# Patient Record
Sex: Male | Born: 1960 | Race: White | Hispanic: No | Marital: Married | Smoking: Former smoker
Health system: Southern US, Community
[De-identification: ages and names within clinical notes are randomized; demographics above are authoritative.]

## PROBLEM LIST (undated history)

## (undated) DIAGNOSIS — F4310 Post-traumatic stress disorder, unspecified: Secondary | ICD-10-CM

## (undated) DIAGNOSIS — I10 Essential (primary) hypertension: Secondary | ICD-10-CM

## (undated) DIAGNOSIS — I2510 Atherosclerotic heart disease of native coronary artery without angina pectoris: Secondary | ICD-10-CM

## (undated) DIAGNOSIS — I219 Acute myocardial infarction, unspecified: Secondary | ICD-10-CM

## (undated) DIAGNOSIS — E782 Mixed hyperlipidemia: Secondary | ICD-10-CM

## (undated) HISTORY — PX: CORONARY ARTERY BYPASS GRAFT: SHX141

## (undated) HISTORY — DX: Essential (primary) hypertension: I10

## (undated) HISTORY — PX: CAROTID STENT: SHX1301

## (undated) HISTORY — DX: Atherosclerotic heart disease of native coronary artery without angina pectoris: I25.10

## (undated) HISTORY — PX: HAND SURGERY: SHX662

## (undated) HISTORY — DX: Post-traumatic stress disorder, unspecified: F43.10

## (undated) HISTORY — DX: Acute myocardial infarction, unspecified: I21.9

## (undated) HISTORY — PX: BACK SURGERY: SHX140

## (undated) HISTORY — DX: Mixed hyperlipidemia: E78.2

---

## 1998-04-29 ENCOUNTER — Inpatient Hospital Stay (HOSPITAL_COMMUNITY): Admission: EM | Admit: 1998-04-29 | Discharge: 1998-05-03 | Payer: Self-pay | Admitting: Cardiology

## 1998-05-03 ENCOUNTER — Encounter: Payer: Self-pay | Admitting: Cardiology

## 1998-12-02 ENCOUNTER — Ambulatory Visit (HOSPITAL_COMMUNITY): Admission: RE | Admit: 1998-12-02 | Discharge: 1998-12-02 | Payer: Self-pay | Admitting: Cardiology

## 1999-09-06 ENCOUNTER — Inpatient Hospital Stay (HOSPITAL_COMMUNITY): Admission: EM | Admit: 1999-09-06 | Discharge: 1999-09-08 | Payer: Self-pay | Admitting: Psychiatry

## 2000-06-11 DIAGNOSIS — I219 Acute myocardial infarction, unspecified: Secondary | ICD-10-CM

## 2000-06-11 HISTORY — DX: Acute myocardial infarction, unspecified: I21.9

## 2001-03-04 ENCOUNTER — Inpatient Hospital Stay (HOSPITAL_COMMUNITY): Admission: EM | Admit: 2001-03-04 | Discharge: 2001-03-07 | Payer: Self-pay | Admitting: Cardiology

## 2001-03-04 ENCOUNTER — Encounter: Payer: Self-pay | Admitting: Cardiology

## 2002-04-01 ENCOUNTER — Encounter: Payer: Self-pay | Admitting: Cardiology

## 2004-04-20 ENCOUNTER — Observation Stay (HOSPITAL_COMMUNITY): Admission: RE | Admit: 2004-04-20 | Discharge: 2004-04-21 | Payer: Self-pay | Admitting: Neurosurgery

## 2004-11-17 ENCOUNTER — Ambulatory Visit: Payer: Self-pay | Admitting: Cardiology

## 2005-02-16 ENCOUNTER — Ambulatory Visit: Payer: Self-pay | Admitting: Cardiology

## 2005-02-28 ENCOUNTER — Ambulatory Visit: Payer: Self-pay | Admitting: Cardiology

## 2005-05-28 ENCOUNTER — Ambulatory Visit: Payer: Self-pay | Admitting: Cardiology

## 2005-06-08 ENCOUNTER — Ambulatory Visit: Payer: Self-pay | Admitting: Cardiology

## 2006-01-24 ENCOUNTER — Ambulatory Visit: Payer: Self-pay | Admitting: Cardiology

## 2006-07-06 ENCOUNTER — Encounter: Payer: Self-pay | Admitting: Cardiology

## 2006-08-28 ENCOUNTER — Encounter: Payer: Self-pay | Admitting: Physician Assistant

## 2006-08-28 ENCOUNTER — Ambulatory Visit: Payer: Self-pay | Admitting: Cardiology

## 2006-10-08 ENCOUNTER — Ambulatory Visit: Payer: Self-pay | Admitting: Cardiology

## 2006-10-10 ENCOUNTER — Ambulatory Visit: Payer: Self-pay | Admitting: Cardiology

## 2008-07-27 ENCOUNTER — Ambulatory Visit: Payer: Self-pay | Admitting: Cardiology

## 2008-12-23 ENCOUNTER — Telehealth: Payer: Self-pay | Admitting: Cardiology

## 2009-03-14 ENCOUNTER — Ambulatory Visit: Payer: Self-pay | Admitting: Cardiology

## 2009-03-14 DIAGNOSIS — I10 Essential (primary) hypertension: Secondary | ICD-10-CM

## 2009-03-14 DIAGNOSIS — Z951 Presence of aortocoronary bypass graft: Secondary | ICD-10-CM

## 2009-03-14 DIAGNOSIS — F4310 Post-traumatic stress disorder, unspecified: Secondary | ICD-10-CM

## 2009-03-14 DIAGNOSIS — M79609 Pain in unspecified limb: Secondary | ICD-10-CM

## 2009-03-14 DIAGNOSIS — E782 Mixed hyperlipidemia: Secondary | ICD-10-CM

## 2009-03-18 ENCOUNTER — Encounter: Payer: Self-pay | Admitting: Cardiology

## 2009-03-24 ENCOUNTER — Encounter (INDEPENDENT_AMBULATORY_CARE_PROVIDER_SITE_OTHER): Payer: Self-pay | Admitting: *Deleted

## 2009-04-26 ENCOUNTER — Telehealth (INDEPENDENT_AMBULATORY_CARE_PROVIDER_SITE_OTHER): Payer: Self-pay | Admitting: *Deleted

## 2009-10-25 ENCOUNTER — Encounter: Payer: Self-pay | Admitting: Physician Assistant

## 2010-02-02 ENCOUNTER — Ambulatory Visit: Payer: Self-pay | Admitting: Cardiology

## 2010-02-02 ENCOUNTER — Encounter (INDEPENDENT_AMBULATORY_CARE_PROVIDER_SITE_OTHER): Payer: Self-pay | Admitting: *Deleted

## 2010-02-02 DIAGNOSIS — I2510 Atherosclerotic heart disease of native coronary artery without angina pectoris: Secondary | ICD-10-CM

## 2010-02-14 ENCOUNTER — Encounter: Payer: Self-pay | Admitting: Physician Assistant

## 2010-02-14 ENCOUNTER — Ambulatory Visit: Payer: Self-pay | Admitting: Cardiology

## 2010-03-01 DIAGNOSIS — I2510 Atherosclerotic heart disease of native coronary artery without angina pectoris: Secondary | ICD-10-CM | POA: Insufficient documentation

## 2010-03-06 ENCOUNTER — Telehealth (INDEPENDENT_AMBULATORY_CARE_PROVIDER_SITE_OTHER): Payer: Self-pay | Admitting: *Deleted

## 2010-03-08 ENCOUNTER — Ambulatory Visit: Payer: Self-pay | Admitting: Cardiology

## 2010-03-15 ENCOUNTER — Ambulatory Visit: Payer: Self-pay | Admitting: Cardiology

## 2010-03-15 ENCOUNTER — Encounter (INDEPENDENT_AMBULATORY_CARE_PROVIDER_SITE_OTHER): Payer: Self-pay | Admitting: *Deleted

## 2010-03-15 DIAGNOSIS — F17200 Nicotine dependence, unspecified, uncomplicated: Secondary | ICD-10-CM

## 2010-03-15 DIAGNOSIS — R072 Precordial pain: Secondary | ICD-10-CM

## 2010-03-16 ENCOUNTER — Encounter: Payer: Self-pay | Admitting: Cardiology

## 2010-03-17 ENCOUNTER — Encounter: Payer: Self-pay | Admitting: Cardiology

## 2010-03-20 ENCOUNTER — Encounter: Payer: Self-pay | Admitting: Cardiology

## 2010-03-21 ENCOUNTER — Ambulatory Visit: Payer: Self-pay | Admitting: Cardiology

## 2010-03-21 ENCOUNTER — Inpatient Hospital Stay (HOSPITAL_BASED_OUTPATIENT_CLINIC_OR_DEPARTMENT_OTHER): Admission: RE | Admit: 2010-03-21 | Discharge: 2010-03-21 | Payer: Self-pay | Admitting: Cardiology

## 2010-03-29 ENCOUNTER — Encounter (INDEPENDENT_AMBULATORY_CARE_PROVIDER_SITE_OTHER): Payer: Self-pay | Admitting: *Deleted

## 2010-03-30 ENCOUNTER — Encounter: Payer: Self-pay | Admitting: Cardiology

## 2010-04-13 ENCOUNTER — Ambulatory Visit: Payer: Self-pay | Admitting: Physician Assistant

## 2010-04-18 ENCOUNTER — Encounter: Payer: Self-pay | Admitting: Cardiology

## 2010-07-11 NOTE — Miscellaneous (Signed)
Summary: rx - pravastatin  Clinical Lists Changes  Medications: Added new medication of PRAVASTATIN SODIUM 40 MG TABS (PRAVASTATIN SODIUM) take 2 tabs at bedtime

## 2010-07-11 NOTE — Assessment & Plan Note (Signed)
Summary: f/u echo  --agh   Visit Type:  Follow-up Primary Provider:  Bluth   History of Present Illness: the patient is a 50 year old male with a history of coronary artery disease, status post coronary artery bypass grafting in 1999. In 2002 the patient had a myocardial infarction with ventricular fibrillation and was found to have an occluded vein graft.the patient also has a history of posttraumatic stress syndrome/bipolar disorder.  The patient is unable to tolerate statin therapy on a daily basis and takes it every other day. He also takes gemfibrozil. He has been taken off Seroquel because of his high triglycerides but now has more difficulty sleeping on low dose trazodone. He also has lost weight likely because his Seroquel was discontinued.  During the last office visit the patient complained of chest tightness in the lower sternal area which happens intermittently. There was no associated shortness of breath or radiation of the pain. Nitroglycerin does not consistently  resolve his chest pain.sometimes he feels the pain feels like indigestion. He does not like to take nitroglycerin because of headaches. Subsequently the patient was referred for a myocardial perfusion study. Ejection fraction was 49% there appeared to be several small reversible defects in the study was felt to be abnormal. An echocardiogram demonstrated an EF of 50-55% with the mid anteroseptal and apical wall segments hypokinetic. The patient states that since his stress test he has felt poorly with ongoing chest pain.  The patient is here to discuss the possibility of cardiac catheterization for further evaluation. He was also counseled regarding tobacco use.   Preventive Screening-Counseling & Management  Alcohol-Tobacco     Smoking Status: current     Smoking Cessation Counseling: yes     Packs/Day: 1/2 PPD  Current Medications (verified): 1)  Metoprolol Succinate 50 Mg Xr24h-Tab (Metoprolol Succinate) .... Take  1 Tablet By Mouth Once A Day 2)  Norvasc 5 Mg Tabs (Amlodipine Besylate) .... Take 1 Tablet By Mouth Once A Day 3)  Hydrocodone-Acetaminophen 5-325 Mg Tabs (Hydrocodone-Acetaminophen) .... Take 1 Tablet By Mouth Four Times A Day 4)  Alprazolam 1 Mg Tabs (Alprazolam) .... Take 1 Tablet By Mouth Four Times A Day 5)  Aspirin 325 Mg Tabs (Aspirin) .... Take 1 Tablet By Mouth Once A Day 6)  Multivitamins  Tabs (Multiple Vitamin) .... Take 1 Tablet By Mouth Once A Day 7)  Vitamin D 400 Unit Caps (Cholecalciferol) .... Take 1 Tablet By Mouth Once A Day 8)  Nitrostat 0.4 Mg Subl (Nitroglycerin) .... Dissolve One Tablet Under Tongue For Severe Chest Pain As Needed Every 5 Minutes, Not To Exceed 3 in 15 Min Time Frame 9)  Pravastatin Sodium 40 Mg Tabs (Pravastatin Sodium) .... Take 1 Tablet By Mouth Once A Day 10)  Gemfibrozil 600 Mg Tabs (Gemfibrozil) .... Take 1 Tablet By Mouth Two Times A Day 11)  Trazodone Hcl 50 Mg Tabs (Trazodone Hcl) .... Take 1 Tablet By Mouth Once A Day 12)  Prednisone 20 Mg Tabs (Prednisone) .... Take 60 Mg (3 Tabs) At 6:00p.m. On The Night Prior To Procedure, 12 Midnight, and 6:00a.m. of Procedure 13)  Diphenhydramine Hcl 25 Mg Caps (Diphenhydramine Hcl) .... Take At 6:00p.m. The Night Prior To Procedur, 12 Midnight, and 6:00a.m. of Procedure 14)  Zantac 150 Maximum Strength 150 Mg Tabs (Ranitidine Hcl) .... Take At 6:00p.m. The Night Prior To Procedure, 12 Midnight, and 6:00a.m. of Procedure  Allergies (verified): 1)  ! * Ivp Dye  Comments:  Nurse/Medical Assistant: The patient's medication  list and allergies were reviewed with the patient and were updated in the Medication and Allergy Lists.  Past History:  Family History: Last updated: Mar 15, 2009 mother died at age 58 from coronary artery disease the patient has a younger brother who had he has an older sister that died from coronary artery disease. Of her strong family history of hypercholesterolemia.  Social  History: Last updated: 03/15/2009 Tobacco Use - Yes.  Alcohol Use - no  Risk Factors: Smoking Status: current (03/15/2010) Packs/Day: 1/2 PPD (03/15/2010)  Past Medical History: chest pain rule out angina coronary artery disease status post coronary artery bypass grafting in 1999 with a vein graft to the circumflex AMI with ventricular fibrillation arrest September 2002 with occluded vein graft myocardial perfusion studycall February 14, 2010 abnormal with small perfusion defects and an ejection fraction of 49% Echocardiogram September 2011 ejection fraction 50-55%, mid anteroseptal and apical anterior wall motion segments abnormalities dyslipidemia history of intravenous dye allergy bipolar disorder tobacco use chronic back pain hypertension  Social History: Packs/Day:  1/2 PPD  Vital Signs:  Patient profile:   50 year old male Height:      66 inches Weight:      201 pounds Pulse rate:   69 / minute BP sitting:   130 / 78  (left arm) Cuff size:   large  Vitals Entered By: Carlye Grippe (March 15, 2010 10:10 AM)   Impression & Recommendations:  Problem # 1:  CORONARY ATHEROSCLEROSIS NATIVE CORONARY ARTERY (ICD-414.01) the patient has ongoing chest pain and does not like to take nitroglycerin. He has an abnormal perfusion study. I referred the patient for cardiac catheterization. I discussed risk and benefits of the procedure with the patient and is willing to proceed. He does have an IVP dye allergy and will be pretreated appropriately with prednisone, H1 and H2 blockers. His updated medication list for this problem includes:    Metoprolol Succinate 50 Mg Xr24h-tab (Metoprolol succinate) .Marland Kitchen... Take 1 tablet by mouth once a day    Norvasc 5 Mg Tabs (Amlodipine besylate) .Marland Kitchen... Take 1 tablet by mouth once a day    Aspirin 325 Mg Tabs (Aspirin) .Marland Kitchen... Take 1 tablet by mouth once a day    Nitrostat 0.4 Mg Subl (Nitroglycerin) .Marland Kitchen... Dissolve one tablet under tongue for  severe chest pain as needed every 5 minutes, not to exceed 3 in 15 min time frame  Problem # 2:  PRECORDIAL PAIN (ICD-786.51) some atypical features, but in light of abnormal perfusion study we'll proceed with cardiac catheterization His updated medication list for this problem includes:    Metoprolol Succinate 50 Mg Xr24h-tab (Metoprolol succinate) .Marland Kitchen... Take 1 tablet by mouth once a day    Norvasc 5 Mg Tabs (Amlodipine besylate) .Marland Kitchen... Take 1 tablet by mouth once a day    Aspirin 325 Mg Tabs (Aspirin) .Marland Kitchen... Take 1 tablet by mouth once a day    Nitrostat 0.4 Mg Subl (Nitroglycerin) .Marland Kitchen... Dissolve one tablet under tongue for severe chest pain as needed every 5 minutes, not to exceed 3 in 15 min time frame  Orders: T-Basic Metabolic Panel [id number]) T-CBC No Diff ([id number]) T-Protime, Auto ([id number]) T-PTT ([id number]) T-Chest x-ray, 2 views (66440) Cardiac Catheterization (Cardiac Cath)  Problem # 3:  PTSD (ICD-309.81) Assessment: Comment Only  Problem # 4:  CORONARY ARTERY BYPASS GRAFT, HX OF (ICD-V45.81) Assessment: Comment Only  Problem # 5:  TOBACCO ABUSE (ICD-305.1) I referred the patient to 1-800 -QUIT-NOW.  Patient Instructions: 1)  [facility]  Cath  2)  Stop smoking (1-800-QUIT-NOW) 3)  Follow up in  6 months Prescriptions: ZANTAC 150 MAXIMUM STRENGTH 150 MG TABS (RANITIDINE HCL) take at 6:00p.m. the night prior to procedure, 12 midnight, and 6:00a.m. of procedure  #3 x 0   Entered by:   Hoover Brunette, LPN   Authorized by:   Lewayne Bunting, MD, [facility]   Signed by:   Hoover Brunette, LPN on 16/03/9603   Method used:   Electronically to        [facility] # [phone]* (retail)       [address]       [place], [place]  [address]       Ph: [phone] or [phone]       Fax: [phone]   RxID:   [id number] DIPHENHYDRAMINE HCL 25 MG CAPS (DIPHENHYDRAMINE HCL) take at 6:00p.m. the night prior to procedur, 12 midnight, and 6:00a.m. of procedure  #3 x 0    Entered by:   Hoover Brunette, LPN   Authorized by:   Lewayne Bunting, MD, [facility]   Signed by:   Hoover Brunette, LPN on 74/25/9563   Method used:   Electronically to        [facility] # [phone]* (retail)       [address]       [place], [place]  [address]       Ph: [phone] or [phone]       Fax: [phone]   RxID:   [id number] PREDNISONE 20 MG TABS (PREDNISONE) take 60 mg (3 tabs) at 6:00p.m. on the night prior to procedure, 12 midnight, and 6:00a.m. of procedure  #9 x 0   Entered by:   Hoover Brunette, LPN   Authorized by:   Lewayne Bunting, MD, [facility]   Signed by:   Hoover Brunette, LPN on 61/60/7371   Method used:   Electronically to        [facility] # [phone]* (retail)       [address]       [place], [place]  [address]       Ph: [phone] or [phone]       Fax: [phone]   RxID:   [id number]

## 2010-07-11 NOTE — Letter (Signed)
Summary: Letter/ APPLICATION FOR HANDICAPPED STICKER  Letter/ APPLICATION FOR HANDICAPPED STICKER   Imported By: Dorise Hiss 02/03/2010 14:18:51  _____________________________________________________________________  External Attachment:    Type:   Image     Comment:   External Document

## 2010-07-11 NOTE — Letter (Signed)
Summary: Cardiac Cath Instructions - [facility]  [facility] [address]   [place], [place] [address]   Phone: [phone]  Fax: [phone]     03/15/2010 MRN: [id number]  Joseph [name] [address] [place], [place]  [address]  Dear [name],   You are scheduled for a Cardiac Catheterization on Tuesday, October 11 at 7:30 with Dr. Peter Swaziland.   Please arrive to the 1st floor of the Heart and Vascular Center at [facility] at 6:30 am on the day of your procedure. Please do not arrive before 6:30 a.m. Call the Heart and Vascular Center at [phone] if you are unable to make your appointmnet. The Code to get into the parking garage under the building is 0200. Take the elevators to the 1st floor. You must have someone to drive you home. Someone must be with you for the first 24 hours after you arrive home. Please wear clothes that are easy to get on and off and wear slip-on shoes. Do not eat or drink after midnight except water with your medications that morning. Bring all your medications and current insurance cards with you.  ___ DO NOT take these medications before your procedure:  _X__ Make sure you take your aspirin.  _X__ You may take ALL of your medications with water that morning.  ___ DO NOT take ANY medications before your procedure.  _X__ Pre-med instructions: see attached sheet.    The usual length of stay after your procedure is 2 to 3 hours. This can vary.  If you have any questions, please call the office at the number listed above.  Hoover Brunette, LPN                  Directions to the [facility]  Please Note : Park in [place] under the building not the parking deck.  From [employer]: Turn onto [address] Left onto [address] (1st stoplight) Right at the brick entrance to the hospital ([address] circle [address]) Bear to the right and you will see a blue sign "Heart and  Vascular Center" Parking garage is a sharp right'to get through the gate out in the code _______. Once you park, take the elevator to the first floor. Please do not arrive before 0630am. The building will be dark before that time.   From [address] Turn onto [address] Turn left into the brick entrance to the hospital (Main circle drive) Bear to the right and you will see a blue sign "Heart and Vascular Center" Parking garage is a sharp right, to get thru the gate put in the code ____. Once you park, take the elevator to the first floor. Please do not arrive before 0630am. The building will be dark before that time

## 2010-07-11 NOTE — Cardiovascular Report (Signed)
Summary: Cardiac Catheterization  Cardiac Catheterization   Imported By: Dorise Hiss 02/02/2010 08:16:25  _____________________________________________________________________  External Attachment:    Type:   Image     Comment:   External Document

## 2010-07-11 NOTE — Letter (Signed)
Summary: Lexiscan or Dobutamine Pharmacist, community at [facility]  [address]   [place], [place] [address]   Phone: [phone]  Fax: [phone]      [facility] Cardiovascular Services  Lexiscan or Dobutamine Cardiolite Strss Test    [facility]  Appointment Date:_  Appointment Time:_  Your doctor has ordered a CARDIOLITE STRESS TEST using a medication to stimulate exercise so that you will not have to walk on the treadmill to determine the condition of your heart during stress. If you take blood pressure medication, ask your doctor if you should take it the day of your test. You should not have anything to eat or drink at least 4 hours before your test is scheduled, and no caffeine, including decaffeinated tea and coffee, chocolate, and soft drinks for 24 hours before your test.  You will need to register at the Outpatient/Main Entrance at the hospital 15 minutes before your appointment time. It is a good idea to bring a copy of your order with you. They will direct you to the Diagnostic Imaging (Radiology) Department.  You will be asked to undress from the waist up and given a hospital gown to wear, so dress comfortably from the waist down for example: Sweat pants, shorts, or skirt Rubber soled lace up shoes (tennis shoes)  Plan on about three hours from registration to release from the hospital

## 2010-07-11 NOTE — Letter (Signed)
Summary: Internal Correspondence/ FAXED PRE-CATH ORDER  Internal Correspondence/ FAXED PRE-CATH ORDER   Imported By: Dorise Hiss 03/28/2010 15:04:04  _____________________________________________________________________  External Attachment:    Type:   Image     Comment:   External Document

## 2010-07-11 NOTE — Letter (Addendum)
Summary: Letter/ [employer].  Letter/ [employer].   Imported By: Dorise Hiss 04/10/2010 10:35:46  _____________________________________________________________________  External Attachment:    Type:   Image     Comment:   External Document  Appended Document: Letter/ [employer]. Forward to whomever did intervention. See cath records.

## 2010-07-11 NOTE — Progress Notes (Signed)
Summary: ?? d/c Imdur  Phone Note Call from Patient   Summary of Call: Wants to know if he can stop his Imdur.  States he started med on 9/22 and started feeling bad on 9/24.  States that when he gets up from sitting to standing he feels very dizzy and lightheaded.  Did feel like he was going to pass out earlier this a.m. - episode lasted about 30 seconds, but then went away after he bent down with head between knees.  Does have pending echo scheduled for this Wednesday, 9/28. Does not have BP machine at home.  Initial call taken by: Hoover Brunette, LPN,  March 06, 2010 2:37 PM  Follow-up for Phone Call        Yes he can Follow-up by: Lewayne Bunting, MD, [facility],  March 06, 2010 4:39 PM  Additional Follow-up for Phone Call Additional follow up Details #1::        Patient notified.    Additional Follow-up by: Hoover Brunette, LPN,  March 06, 2010 4:56 PM     Appended Document: ?? d/c Imdur I reveiwed ECHO today EF=50-55%. Small degree anterior hypokinesis. Unlesscomplaints of chest pain, continue medical rx and risk factor modification.  Appended Document: ?? d/c Imdur Can discuss at OV tomorrow, 10/5.

## 2010-07-11 NOTE — Assessment & Plan Note (Signed)
Summary: EPH   Visit Type:  Follow-up Primary Provider:  Bluth   History of Present Illness: patient returns for post catheterization followup, following recent referral, by Dr. Andee Lineman, for evaluation of atypical chest pain and an abnormal perfusion study. Patient did require pretreatment for IVP dye allergy.  Cardiac catheterization was performed on 10/11, by Dr. Peter Swaziland, yielding single-vessel CAD with preserved LVF (EF 55%), with severe inferobasal HK. Specifically, there was 50-60% proximal RCA stenosis, followed by a 40% lesion. There was previously documented proximal occlusion of the CFX at the previous stent site, with excellent bridging collaterals.  Patient underwent single-vessel CABG in 1999, secondary to in-stent restenosis of the CFX. He then had an MI in 2002, secondary to 100% occlusion of the SVG-CFX graft.  Clinically, he continues to complain of intermittent chest pain which is unpredictable, and lasts a few minutes in duration. It is unrelieved by either Zantac or one nitroglycerin tablet.at  Of note, this has not changed significantly from his baseline.  He also complains of intermittent "skipped beats", but denies any suggestion of "fluttering". Unfortunately, patient continues to smoke.   Patient denies any known complications of the right groin incision site.  Preventive Screening-Counseling & Management  Alcohol-Tobacco     Smoking Status: current     Smoking Cessation Counseling: yes     Packs/Day: 1/2 PPD  Current Medications (verified): 1)  Metoprolol Succinate 50 Mg Xr24h-Tab (Metoprolol Succinate) .... Take 1 Tablet By Mouth Once A Day 2)  Norvasc 5 Mg Tabs (Amlodipine Besylate) .... Take 1 Tablet By Mouth Once A Day 3)  Hydrocodone-Acetaminophen 5-325 Mg Tabs (Hydrocodone-Acetaminophen) .... Take 1 Tablet By Mouth Four Times A Day 4)  Alprazolam 1 Mg Tabs (Alprazolam) .... Take 1 Tablet By Mouth Four Times A Day 5)  Aspirin 325 Mg Tabs (Aspirin)  .... Take 1 Tablet By Mouth Once A Day 6)  Multivitamins  Tabs (Multiple Vitamin) .... Take 1 Tablet By Mouth Once A Day 7)  Vitamin D 400 Unit Caps (Cholecalciferol) .... Take 1 Tablet By Mouth Once A Day 8)  Nitrostat 0.4 Mg Subl (Nitroglycerin) .... Dissolve One Tablet Under Tongue For Severe Chest Pain As Needed Every 5 Minutes, Not To Exceed 3 in 15 Min Time Frame 9)  Pravastatin Sodium 40 Mg Tabs (Pravastatin Sodium) .... Take 1 Tablet By Mouth Once A Day 10)  Gemfibrozil 600 Mg Tabs (Gemfibrozil) .... Take 1 Tablet By Mouth Two Times A Day 11)  Trazodone Hcl 50 Mg Tabs (Trazodone Hcl) .... Take 1 Tablet By Mouth Once A Day  Allergies (verified): 1)  ! * Ivp Dye  Comments:  Nurse/Medical Assistant: The patient's medication list and allergies were reviewed with the patient and were updated in the Medication and Allergy Lists.  Past History:  Past Medical History: Last updated: 03/15/2010 chest pain rule out angina coronary artery disease status post coronary artery bypass grafting in 1999 with a vein graft to the circumflex AMI with ventricular fibrillation arrest September 2002 with occluded vein graft myocardial perfusion studycall February 14, 2010 abnormal with small perfusion defects and an ejection fraction of 49% Echocardiogram September 2011 ejection fraction 50-55%, mid anteroseptal and apical anterior wall motion segments abnormalities dyslipidemia history of intravenous dye allergy bipolar disorder tobacco use chronic back pain hypertension  Review of Systems       No fevers, chills, hemoptysis, dysphagia, melena, hematocheezia, hematuria, rash, claudication, orthopnea, pnd, pedal edema. All other systems negative.   Vital Signs:  Patient profile:  50 year old male Height:      66 inches Weight:      203 pounds Pulse rate:   72 / minute BP sitting:   136 / 90  (left arm) Cuff size:   large  Vitals Entered By: Carlye Grippe (April 13, 2010 1:13  PM)  Physical Exam  Additional Exam:  GEN: 50 year old male, mildly obese, sitting upright, no distress HEENT: NCAT,PERRLA,EOMI NECK: palpable pulses, no bruits; no JVD; no TM LUNGS: CTA bilaterally HEART: RRR (S1S2); no significant murmurs; no rubs; no gallops ABD: soft, NT; intact BS EXT: intact distal pulses; no edema SKIN: warm, dry MUSC: no obvious deformity NEURO: A/O (x3)     Impression & Recommendations:  Problem # 1:  PRECORDIAL PAIN (ICD-786.51)  continue medical management for single-vessel coronary artery disease, status post recent cardiac catheterization. The patient's symptoms are atypical in that they are unpredictable in onset, and not strictly correlated with exertion. Will increase beta blocker both for additional antianginal benefit, as well as suppression of probable PVCs. Will decrease aspirin to 81 mg daily, indefinitely.  Problem # 2:  HYPERLIPIDEMIA, MIXED (ICD-272.2)  we'll request most recent lipid profile from Dr. Pauletta Browns office. Aggressive management recommended with target LDL 70 or less, if feasible. Patient is on additional therapy for reported hypertriglyceridemia  Problem # 3:  TOBACCO ABUSE (ICD-305.1)  unfortunately, patient continues to smoke. He was given the 1 800 QUIT NOW number, at time of last visit.  Problem # 4:  PTSD (ICD-309.81) Assessment: Comment Only  Patient Instructions: 1)  Your physician wants you to follow-up in: 3 months. You will receive a reminder letter in the mail one-two months in advance. If you don't receive a letter, please call our office to schedule the follow-up appointment. 2)  Decrease Aspirin to 162 mg (1/2 of 325mg  tablet), then when you use all of your 325mg  tablets. Start 81mg  Aspirin once daily. 3)  Increase Toprol (metoprolol) to 75mg  by mouth once daily. This is 1 1/2 of your 50mg  tablets.  4)  We have requested your recent labs from Dr. Loney Hering.  Prescriptions: METOPROLOL SUCCINATE 50 MG XR24H-TAB  (METOPROLOL SUCCINATE) Take 1 1/2 tablet by mouth once a day  #45 x 6   Entered by:   Cyril Loosen, RN, BSN   Authorized by:   Nelida Meuse, PA-C   Signed by:   Cyril Loosen, RN, BSN on 04/13/2010   Method used:   Electronically to        [facility] # [phone]* (retail)       [address]       [place], [place]  [address]       Ph: [phone] or [phone]       Fax: [phone]   RxID:   [id number]  I have reviewed and approved all prescriptions at the time of the office visit. Nelida Meuse, PA-C  April 13, 2010 2:02 PM

## 2010-07-11 NOTE — Assessment & Plan Note (Signed)
Summary: 6 MO FU   Visit Type:  Follow-up Primary Provider:  Bluth   History of Present Illness: the patient is a 50 year old male with a history of coronary artery disease, status post coronary artery bypass grafting in 1999. In 2002 the patient had a myocardial infarction with ventricular fibrillation and was found to have an occluded vein graft. He has normal LV function however.  The patient is doing well from a cardiovascular perspective but does complain of some tight feeling in the lower sternal area which happens intermittently. There is no associated shortness of breath or radiation of the pain. Nitroglycerin does not consistently relieve the problem. He states first couple of weeks his pain was very bothersome. It has got a little bit better now.  The patient is unable to tolerate statin therapy on a daily basis and takes it every other day. He also takes gemfibrozil. He has been taken off Seroquel because of his high triglycerides but now has more difficulty sleeping on low dose trazodone. He also has lost weight likely because his Seroquel was discontinued.  The patient denies any palpitations orthopnea PND.  In the last visit I told the patient to get a cat as a companion. He states this has helped him a lot with his bipolar disorder as well as been a great companion for his daughter who suffers chronic migraine headaches.  Preventive Screening-Counseling & Management  Alcohol-Tobacco     Smoking Status: current     Smoking Cessation Counseling: yes     Packs/Day: 1/4 PPD  Current Medications (verified): 1)  Metoprolol Succinate 50 Mg Xr24h-Tab (Metoprolol Succinate) .... Take 1 Tablet By Mouth Once A Day 2)  Norvasc 5 Mg Tabs (Amlodipine Besylate) .... Take 1 Tablet By Mouth Once A Day 3)  Hydrocodone-Acetaminophen 5-325 Mg Tabs (Hydrocodone-Acetaminophen) .... Take 1 Tablet By Mouth Four Times A Day 4)  Alprazolam 1 Mg Tabs (Alprazolam) .... Take 1 Tablet By Mouth Four  Times A Day 5)  Aspirin 325 Mg Tabs (Aspirin) .... Take 1 Tablet By Mouth Once A Day 6)  Multivitamins  Tabs (Multiple Vitamin) .... Take 1 Tablet By Mouth Once A Day 7)  Vitamin D 400 Unit Caps (Cholecalciferol) .... Take 1 Tablet By Mouth Once A Day 8)  Nitrostat 0.4 Mg Subl (Nitroglycerin) .... Dissolve One Tablet Under Tongue For Severe Chest Pain As Needed Every 5 Minutes, Not To Exceed 3 in 15 Min Time Frame 9)  Pravastatin Sodium 40 Mg Tabs (Pravastatin Sodium) .... Take 1 Tablet By Mouth Once A Day 10)  Gemfibrozil 600 Mg Tabs (Gemfibrozil) .... Take 1 Tablet By Mouth Two Times A Day 11)  Trazodone Hcl 50 Mg Tabs (Trazodone Hcl) .... Take 1 Tablet By Mouth Once A Day  Allergies (verified): 1)  ! * Ivp Dye  Comments:  Nurse/Medical Assistant: The patient's medication list and allergies were reviewed with the patient and were updated in the Medication and Allergy Lists.  Past History:  Past Medical History: Last updated: 07/26/2008 chest pain rule out angina coronary artery disease status post coronary artery bypass grafting in 1999 with a vein graft to the circumflex AMI with ventricular fibrillation arrest September 2002 with occluded vein graft preserved left ventricular function next dyslipidemia history of intravenous dye allergy bipolar disorder tobacco use chronic back pain hypertension  Family History: Last updated: 2009/03/24 mother died at age 17 from coronary artery disease the patient has a younger brother who had he has an older sister that died from  coronary artery disease. Of her strong family history of hypercholesterolemia.  Social History: Last updated: 03/14/2009 Tobacco Use - Yes.  Alcohol Use - no  Risk Factors: Smoking Status: current (02/02/2010) Packs/Day: 1/4 PPD (02/02/2010)  Social History: Packs/Day:  1/4 PPD  Vital Signs:  Patient profile:   50 year old male Height:      66 inches Weight:      205 pounds BMI:     33.21 Pulse  rate:   78 / minute BP sitting:   117 / 87  (left arm) Cuff size:   regular  Vitals Entered By: Carlye Grippe (February 02, 2010 10:01 AM)  Nutrition Counseling: Patient's BMI is greater than 25 and therefore counseled on weight management options.  Physical Exam  Additional Exam:  General: Well-developed, well-nourished in no distress head: Normocephalic and atraumatic eyes PERRLA/EOMI intact, conjunctiva and lids normal nose: No deformity or lesions mouth normal dentition, normal posterior pharynx neck: Supple, no JVD.  No masses, thyromegaly or abnormal cervical nodes lungs: Normal breath sounds bilaterally without wheezing.  Normal percussion heart: regular rate and rhythm with normal S1 and S2, no S3 or S4.  PMI is normal.  No pathological murmurs abdomen: Normal bowel sounds, abdomen is soft and nontender without masses, organomegaly or hernias noted.  No hepatosplenomegaly musculoskeletal: Back normal, normal gait muscle strength and tone normal pulsus: Pulse is normal in all 4 extremities Extremities: No peripheral pitting edema neurologic: Alert and oriented x 3 skin: Intact without lesions or rashes cervical nodes: No significant adenopathy psychologic: Normal affect    Impression & Recommendations:  Problem # 1:  CORONARY ARTERY BYPASS GRAFT, HX OF (ICD-V45.81) the patient reports some substernal chest pain. It has been several years since we did a stress test. I ordered a Lexiscan. In particular the patient continues to smokeiand his therapy with statin and suboptimal  Problem # 2:  HYPERLIPIDEMIA, MIXED (ICD-272.2) as outlined above. His updated medication list for this problem includes:    Pravastatin Sodium 40 Mg Tabs (Pravastatin sodium) .Marland Kitchen... Take 1 tablet by mouth once a day    Gemfibrozil 600 Mg Tabs (Gemfibrozil) .Marland Kitchen... Take 1 tablet by mouth two times a day  Problem # 3:  PTSD (ICD-309.81) Assessment: Comment Only  Problem # 4:  ESSENTIAL HYPERTENSION,  BENIGN (ICD-401.1) blood pressure is well controlled on his current medical regimen. The following medications were removed from the medication list:    Hydrochlorothiazide 25 Mg Tabs (Hydrochlorothiazide) .Marland Kitchen... Take 1 tablet by mouth once a day His updated medication list for this problem includes:    Metoprolol Succinate 50 Mg Xr24h-tab (Metoprolol succinate) .Marland Kitchen... Take 1 tablet by mouth once a day    Norvasc 5 Mg Tabs (Amlodipine besylate) .Marland Kitchen... Take 1 tablet by mouth once a day    Aspirin 325 Mg Tabs (Aspirin) .Marland Kitchen... Take 1 tablet by mouth once a day  Other Orders: Nuclear Med (Nuc Med)  Patient Instructions: 1)  Lexiscan stress test 2)  Follow up in  6 months

## 2010-07-11 NOTE — Letter (Signed)
Summary: Engineer, materials at [facility]  [address]   [place], [place] [address]   Phone: [phone]  Fax: [phone]        March 29, 2010 MRN: [id number]   [facility] [address] [place], [place]  [address]   Dear Joseph [name],  Your test ordered by Selena Batten has been reviewed by your physician (or physician assistant) and was found to be normal or stable. Your physician (or physician assistant) felt no changes were needed at this time.  ____ Echocardiogram  ____ Cardiac Stress Test  __X__ Lab Work - precath  ____ Peripheral vascular study of arms, legs or neck  ____ CT scan or X-ray  ____ Lung or Breathing test  ____ Other:   Thank you.   Hoover Brunette, LPN    Duane Boston, M.D., F.A.C.C. Thressa Sheller, M.D., F.A.C.C. Oneal Grout, M.D., F.A.C.C. Cheree Ditto, M.D., F.A.C.C. Daiva Nakayama, M.D., F.A.C.C. Kenney Houseman, M.D., F.A.C.C. Jeanne Ivan, PA-C

## 2010-07-11 NOTE — Letter (Signed)
Summary: Appointment -missed  [facility] [address]   [place], [place] [address]   Phone: [phone]  Fax: [phone]     March 30, 2010 MRN: [id number]     [facility] [address] [place], [place]  [address]     Dear Mr. Brooke Dare,  Our records indicate you missed your appointment on March 30, 2010                        with Dr.   Andee Lineman .   It is very important that we reach you to reschedule this appointment. We look forward to participating in your health care needs.   Please contact us at the number listed above at your earliest convenience to reschedule this appointment.   Sincerely,    Glass blower/designer

## 2010-10-27 NOTE — Assessment & Plan Note (Signed)
[facility]                            [facility] OFFICE NOTE   NAME:Joseph [name], Joseph [name]                          MRN:          [id number]  DATE:01/24/2006                            DOB:          [birth year]    PRIMARY CARDIOLOGIST:  Learta Codding, MD   REASON FOR OFFICE VISIT:  Joseph [name] is a 50 year old male, with known severe  coronary artery disease, last seen in December possibly 06, by Roxanne Mins, PA-C, who now presents in follow-up.   Since last seen in the office the patient continues to report intermittent  chest pains which have no strict correlation with exertion or activity.  Unfortunately,  Joseph [name] appears to be severely compromised in his mobility  secondary to severe low back pain.  He has history of lower back surgery and  has been told that he might benefit from a spinal fusion.  However, he is  reluctant to just procedure with this, aside from his concerns with respect  to his heart disease.   Joseph [name] has apparently also been enduring quite a bit of stress in this  past month.  He also continues to smoke, but presents today, motivated to  stop smoking.  He has heard about Chantix.  He has tried both Wellbutrin and  patches in the past, with no long term success.   Also of note, the patient continues to complain of chronic fatigue and  feeling tired.  He is on total disability secondary to his heart condition  and chronic back pain.  He reports exertional dyspnea, but is very limited  in how much he can walk secondary to severe back pain.   Electrocardiogram today reveals normal sinus rhythm at 82 beats per minute  with incomplete right bundle branch block and no significant changes since  previous EKG.   CURRENT MEDICATIONS:  1. Xanax 1 mg four times daily as needed.  2. Coated aspirin 81 q.d.  3. Zocor 80 q.d.  4. Toprol XL 50 q.d.  5. Vicodin 5/500 three times daily as needed.  6. Seroquel 25 mg q.d.  7. Imdur 60  q.d.   PHYSICAL EXAMINATION:  VITAL SIGNS:  Blood pressure 110/84, pulse 82,  regular weight 238.6.  NECK:  Palpable bilateral carotid pulses without bruits.  LUNGS:  Diminished breath sounds at bases, but without crackles or wheezes.  HEART:  Regular rate and rhythm (S1, S2), no murmurs, rubs or gallops.  ABDOMEN:  Protuberant, nontender.  EXTREMITIES:  Palpable pulses with no significant edema.   IMPRESSION:  1. Coronary artery disease.      a.     Status post CABG 99:  Saphenous vein graft -CFX graft.      b.     Status post acute myocardial infarction, complicated by       ventricular fibrillation and cardiac arrest September 2002:  100%       occlusion SVG- OM graft treated medically, (MI approximately 20 hours       earlier).      c.     Ejection  fraction 55% with inferobasal akinesis.      d.     Nonischemic exercise Cardiolite ejection fraction 47% October       03.  2. Mixed dyslipidemia.  Intolerance to fish oil.  1. Contrast dye allergy.  2. Psychiatric illness.  History of bipolar disorder, depression, and schizophrenia.  History of noncompliance.  1. Ongoing tobacco.  2. Chronic back pain.  3. Hypertension.   PLAN:  1. Patient appears to be well motivated to stop smoking tobacco.  We will      therefore give him a prescription for Chantix to be taken as directed.  2. Patient was unaware that he needs to carry nitroglycerin with him, to      be used as directed.  We will therefore provide him with a prescription      for this as well.  3. At this point the patient was reluctant to further titrate Imdur      secondary to significant headaches.  I therefore recommended that we      have him return in approximately one month to continue close monitoring      and at that time decide whether or not to pursue further evaluation for      his recurrent chest pain that has typical and atypical features.  Of      note patient did mention today that at time of his previous  visit there      was some discussion regarding possible cardiac CT scan for further      evaluation.  At that time he apparently was reluctant to proceed with      repeat coronary angiogram.  4. We will also check a fasting lipid profile at time of his return clinic      visit.                                   Gene Serpe, PA-C                                Learta Codding, MD, [facility]   GS/MedQ  DD:  01/24/2006  DT:  01/24/2006  Job #:  [id number]

## 2010-10-27 NOTE — Op Note (Signed)
[name], SAGAN                   ACCOUNT NO.:  [id number]   MEDICAL RECORD NO.:  [id number]          PATIENT TYPE:  INP   LOCATION:  NA                           FACILITY:  [facility]   PHYSICIAN:  Danae Orleans. Venetia Maxon, M.D.  DATE OF BIRTH:  [birth year]   DATE OF PROCEDURE:  04/20/2004  DATE OF DISCHARGE:                                 OPERATIVE REPORT   PREOPERATIVE DIAGNOSES:  1.  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  2.  Spondylosis.  3.  Degenerative disc disease.  4.  Radiculopathy.   POSTOPERATIVE DIAGNOSES:  1.  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  2.  Spondylosis.  3.  Degenerative disc disease.  4.  Radiculopathy.   PROCEDURE:  Right L1-2 hemisemilaminectomy with microdiscectomy with  microdissection.   SURGEON:  Danae Orleans. Venetia Maxon, M.D.   ASSISTANT:  Clydene Fake, M.D.   ANESTHESIA:  General endotracheal anesthesia.   ESTIMATED BLOOD LOSS:  Minimal.   COMPLICATIONS:  None.   DISPOSITION:  Recovery.   INDICATIONS FOR PROCEDURE:  Joseph [name] is a 50 year old man with severe right-  sided leg pain and right hip flexor weakness with a disc herniation at L1-2  on the right.  Efforts at conservative management did not relieve his pain  and it was elected to take him to surgery for microdiscectomy.   DESCRIPTION OF PROCEDURE:  Mr. Whitworth was brought to the operating room  following satisfactory and uncomplicated induction of general endotracheal  anesthesia and placed intravenous lines.  The patient was placed in prone  position on the Wilson frame.  His back was prepped and draped in the usual  sterile fashion and intraoperative x-ray was taken with a spinal needle,  placed through what was felt to be the L1-2 level and this was confirmed on  intraoperative x-ray.  Subsequently the incision was made after infiltrating  subcutaneous tissues with 0.25% Marcaine and 0.5% lidocaine with 1:200,000  epinephrine.  Incision was made and carried through adipose tissue to the  lumbodorsal fascia which was incised on the right side of midline.  Subperiosteal dissection was performed exposing L1-2 interspace.  Hemisemilaminectomy was performed with high speed drill and completed with  Kerrison rongeurs after confirmatory x-ray demonstrated marker probe at the  L1-2 interspace.  Ligamentum flavum was detached and removed in the  piecemeal fashion.  The lateral recess was decompressed.  The microscope was  brought into the field and using microdissection technique, the common dural  tube was mobilized medially.  The L2 nerve root was identified and epidural  bleeding was controlled with bipolar electrocautery  and with Gelfoam soaked  in thrombin.  There was a laterally based disc herniation  which was  compressing the lateral recess and there appeared to be cephalad disc  material just above the interspace as well as some caudally migrated disc  material.  The interspace was incised with #15 blade and disc material was  removed in piecemeal fashion and osteophytic spurs were taken down with  osteophyte tool.  The interspace was further evacuated  of residual disc  material as  was the lateral recess and medial aspect of the interspace.  The  nerve root appeared well decompressed as did the thecal sac.  Hemostasis was  assured.  The operative site was bathed in 80 mg of Depo-Medrol and 2 mL of  Fentanyl.  The wound was then closed with interrupted Vicryl sutures and  Dermabond was placed as a dressing.  The patient tolerated the procedure  well and was taken to the recovery room in stable and satisfactory  condition.  Counts correct at the end of the case.      [name]   JDS/MEDQ  D:  04/20/2004  T:  04/20/2004  Job:  [id number]

## 2010-10-27 NOTE — Discharge Summary (Signed)
[place]  Patient:    Joseph [name], Joseph [name] Visit Number: [id number] MRN: [id number]          Service Type: MED Location: 2000 2009 01 Attending Physician:  Rollene Rotunda Dictated by:   Tereso Newcomer, P.A. Admit Date:  03/04/2001 Disc. Date: 03/07/01   CC:         [facility], [place], [place]   Discharge Summary  DATE OF BIRTH: 1960/06/12  DISCHARGE DIAGNOSES:  1. Acute myocardial infarction, complicated by ventricular fibrillation     arrest.  2. History of coronary artery disease, status post coronary artery bypass     grafting in 1999.  3. Totally occluded saphenous vein graft to circumflex noted by cardiac     catheterization this admission - medical therapy planned.  4. Mixed dyslipidemia (hyperlipidemia and hypertriglyceridemia).  5. Bipolar disorder.  6. Intravenous dye allergy.  7. Right bundle branch block.  PROCEDURES PERFORMED THIS ADMISSION: Cardiac catheterization by Dr. Charlton Haws on March 04, 2001, revealing left main 20% discrete stenosis, left anterior descending with 20% mid lesion, large ramus branch without significant disease, circumflex artery 100% occluded proximally, previously known; right coronary artery dominant with 20% mid lesion; saphenous vein graft to obtuse marginal branch 100% occluded proximally with vessel filled with clot.  Left ventriculogram revealed akinesis of the inferior base with an ejection fraction of 55% and no mitral regurgitation.  HISTORY OF PRESENT ILLNESS: This 50 year old male was transferred from [facility] on March 04, 2001 for acute MI.  The patient had developed fleeting chest pain while watching TV on the night of admission.  He took aspirin and went to the [facility] Emergency Room.  In the emergency room he developed V tach and V fib and was successfully cardioverted, but had recurrence x 2.  He was treated with lidocaine and amiodarone and was stabilized.  His  EKG revealed right bundle branch block. He was subsequently transferred to [facility] for further evaluation and treatment.  PHYSICAL EXAMINATION:  VITAL SIGNS: On initial examination his blood pressure was 110/60.  NECK: Without JVD or bruits.  CARDIAC: Regular rate and rhythm, S1 and S2, within normal limits.  No murmurs.  ABDOMEN: Soft.  EXTREMITIES: Without edema.  LABORATORY DATA: EKG revealed right bundle branch block.  Chest x-ray showed no acute disease.  HOSPITAL COURSE: The patient was stabilized on amiodarone, Integrelin, heparin, beta-blocker, and IV nitroglycerin.  Lidocaine was discontinued. Plans were for cardiac catheterization on March 04, 2001.  The patient went for cardiac catheterization as planned.  Results are noted above.  During the case the patient had some bleeding from his mouth, and he was previously on heparin and Integrelin.  He also had a right femoral hematoma.  Therefore, Perclose was used for the right femoral artery and this was done with excellent hemostasis.  The patients films were reviewed with Dr. Loraine Leriche Pulsipher.  He felt there was no benefit and there was an increased risk to opening the SVG to circumflex with him being greater than 20 hours post MI. Therefore, medical therapy was continued.  The patient did complain of some pleuritic chest pain on March 05, 2001 and he was started on Motrin.  He was transferred to a telemetry bed.  He was watched for another 48 hours due to his V fib arrest at presentation.  He remained stable throughout the rest of his admission.  His chest was somewhat sore from the cardioversion at [facility].  His vital signs were stable and his right groin was without hematoma or bruit.  Motrin was discontinued.  Mental health was contacted because the patient had stopped his medications previously for his bipolar disorder.  These were restarted this admission.  Case  management helped with getting assistance for his medications.  [facility] would help with providing his psychiatric medications.  The patient had profound hypertriglyceridemia on his lipid profile.  His total cholesterol was 286, triglyceride 676, and HDL 39.  LDL could not be calculated.  The patient was started on Lipitor 40 mg q.h.s.  Our office would provide the patient with samples.  The patient was ambulated on the date of discharge.  Dr. Jens Som saw the patient and he felt he was stable enough for discharge home.  LABORATORY DATA: WBC 14,500, hemoglobin 13.7, hematocrit 38.6, platelet count 257,000.  Sodium 141, potassium 4.2, chloride 105, CO2 27, glucose 122, BUN 12, creatinine 1.2.  Calcium 9.2.  Cardiac enzymes as follows: Total CK 2732, #2 5461, CK-MB 153.4, #2 287; troponin I 5.46, #2 28.29.  Lipid profile as noted above.  TSH 0.708.  DISCHARGE MEDICATIONS:  1. Enteric-coated aspirin 325 mg q.d.  2. Lopressor 25 mg b.i.d.  3. Lipitor 40 mg q.h.s.  4. Risperdal 1 mg q.h.s.  5. Klonopin 1 mg q.i.d.  6. Trileptal 300 mg b.i.d.  7. Nitroglycerin p.r.n. chest pain.  DISCHARGE ACTIVITY: No driving, heavy lifting, exertional work until cleared by M.D.  Kendell Bane DIET: Low-fat/low-sodium.  WOUND CARE: The patient is to call our office here in [place], [place] for any bleeding or bruising.  FOLLOW-UP: He is to go to our office today in [place], [place] for samples of Lipitor 40 mg q.h.s.  He will follow up with [facility] to receive his psychiatric medications.  He will see Dr. Diona Browner in follow-up at the heart center in [place], [place] on Tuesday, March 11, 2001, at 3:30 p.m.  Dictated by:   Tereso Newcomer, P.A. Attending Physician:  Rollene Rotunda DD:  03/07/01 TD:  03/07/01 Job: [id number]

## 2010-10-27 NOTE — Cardiovascular Report (Signed)
[place]  Patient:    Joseph [name], Joseph [name] Visit Number: [id number] MRN: [id number]          Service Type: MED Location: [id number] Attending Physician:  Rollene Rotunda Dictated by:   Noralyn Pick. Eden Emms, M.D. [facility] Proc. Date: 03/04/01 Admit Date:  03/04/2001   CC:         Donzetta Sprung, M.D.  Rollene Rotunda, M.D. [facility]   Cardiac Catheterization  PROCEDURE:  Coronary Angiography.  CARDIOLOGIST:  Noralyn Pick. Eden Emms, M.D. [facility]  INDICATION:  Sudden cardiac arrest with Mi approximately 20 hours ago.  The patient was transferred from [place].  His initial EKG only showed minimal ST segment depression and after being shocked multiple times.  He was not taken acutely to the lab last night.  His CPK bumped in excess of 2700.  RESULTS:  Left main coronary artery had 20% discrete stenosis.  Left anterior descending artery is a large vessel.  There was a 20%  mid vessel lesion.  There was a large ramus branch without significant disease.  The circumflex coronary artery was 100% occluded proximally.  This was known previously and was occluded just before a previous stent implantation.  The right coronary artery is dominant.  There is a 20% mid vessel lesion. There were no left-to-left or right-to-left collaterals to the circumflex distribution.  Saphenous vein graft to the obtuse marginal branch was 100% occluded proximally.  Only 2 to 3 cm of the vessel was visualized and was filled with clot.  There was a very large segment of the vessel, over two-thirds of it, which was never visualized; and, again, the patient had no collaterals.  RAO VENTRICULOGRAPHY:  RAO ventriculography revealed akinesis of the inferior later base.  Ejection fraction was still in the 55% range with no MR.  ASSESSMENT: The films were reviewed with Dr. Gerri Spore since he was scheduled within 20 hours from his MI, and there were no collaterals, it was deemed  not prudent to open the vein graft to the circumflex as there would be little benefit and increased risk of distal embolization.  During the case, the patient had some bleeding from his mouth and was previously on heparin and Integrilin.  His ACT was 182, and he had a small hematoma in his right femoral artery.  Because of this, we decided to Perclose the right femoral artery.  This was done with excellent hemostasis.  Vancomycin 1 g was given at the end of the case. Dictated by:   Noralyn Pick Eden Emms, M.D. [facility] Attending Physician:  Rollene Rotunda DD:  03/04/01 TD:  03/04/01 Job: [id number]

## 2010-10-27 NOTE — Assessment & Plan Note (Signed)
[facility]                          [facility] NOTE   NAME:[name], [name]                          MRN:          [id number]  DATE:08/28/2006                            DOB:          08-Sep-1960    CARDIOLOGIST:  Dr. Lewayne Bunting.   PRIMARY CARE PHYSICIAN:  Unknown.   HISTORY OF PRESENT ILLNESS:  Joseph [name] is a very pleasant 50 year old  male patient with a history of coronary artery disease status post  single vessel CABG in 1999 with subsequent myocardial infarction  complicated by V-fib arrest in September of 2002.  At that time he had  100% occlusion in his vein graft to the OM.  This was treated medically.  He has had chest pain off and on since that time.  He was last seen in  this office January 24, 2006.  At that time discussion was made about up  titrating his medications but he did not agree to this at that time.  He  also has recently had an admission to [facility] with chest pain  July 06, 2006.  He apparently ruled out for myocardial infarction and  he is just now following up in our office today.  He notes that after  his admission he had chest pain probably for about a week and a half to  two weeks that was constant.  He describes a stabbing or heavy chest  pain in the right side of his chest.  He noted that sometimes exertion  made it worse.  He had noticed that Imdur sometimes made it better.  He  denied any associated nausea.  He did feel short of breath with it.  Denies any syncope.  Since that time he has had chest pain probably one  or two times.  He does note increased dyspnea on exertion since then.  He denies syncope.  He does note some shortness of breath that wakes him  from sleep but this has been ongoing for years without changes.  Denies  orthopnea or increasing lower extremity edema.  He occasionally has  palpitations.  He denies any chest pain with exertion at this time.   PAST MEDICAL HISTORY:  Please  see the problem list below.   CURRENT MEDICATIONS:  1. Xanax 1 mg p.r.n.  2. Zocor 80 mg nightly.  3. Toprol XL 50 mg a day.  4. Multivitamin daily.  5. Vicodin p.r.n.  6. Seroquel 25 mg daily.  7. Imdur 60 mg daily.  8. Aspirin 325 mg daily.  9. Imdur p.r.n.   ALLERGIES:  IV DYE AND FISH OILS.   SOCIAL HISTORY:  Continues to smoke cigarettes.   REVIEW OF SYSTEMS:  Please see HPI.  Denies any melena, hematochezia,  hematuria, dysuria, hematemesis, hemoptysis.  The rest of review of  systems are negative.   PHYSICAL EXAMINATION:  He is a well-nourished, well-developed male in no  distress.  Blood pressure is 132/94, pulse 77.  HEENT:  Unremarkable.  NECK:  Without JVD, carotids without bruits bilaterally.  CARDIO:  Normal S1, S2, regular rate and rhythm  without murmurs.  LUNGS:  Clear to auscultation bilaterally without wheezing, rhonchi, or  rales.  ABDOMEN:  Soft, nontender.  EXTREMITIES:  Without edema.  NEUROLOGIC:  He is alert and oriented x3, cranial nerves II-XII grossly  intact.  SKIN:  Warm and dry.  ELECTROCARDIOGRAM:  Reveal sinus rhythm of the heart at 77, incomplete  right bundle branch block.  No ischemic changes.  No significant change  since previous tracings.   IMPRESSION:  1. Chest pain.  2. Coronary artery disease.      a.     Status post coronary artery bypass grafting in 1999 with a       vein graft to the circumflex.      b.     Acute myocardial infarction complicated by V-fib arrest       September 2002 - cardiac catheterization at that time revealing       100 occlusion of the vein graft to the obtuse marginal - treated       medically.  3. Preserved left ventricular function with an ejection fraction of      55%.  4. Mixed dyslipidemia.  5. HISTORY OF IV DYE ALLERGY.  6. History of bipolar disorder, paranoid schizophrenia.  7. Tobacco abuse.  8. Chronic back pain.  9. Hypertension.   PLAN:  Joseph [name] to the office today with  complaints of chest  pain.  He has had this for quite some time now.  He actually had an  admission to [facility] hospital recently with chest pain, he ruled out  for myocardial infarction.  I had a long discussion with him and spent  greater than 30 minutes talking about proceeding with cardiac  catheterization to know once and for all what is going on with his  heart.  He is reluctant to do this and has actually refused proceeding  with cardiac catheterization at this time.  The patient is requesting  proceeding with cardiac CT; something that was discussed with him a long  time ago.  I discussed with Dr. Andee Lineman the possibility of proceeding  with cardiac CT.  He notes that this would be contraindicated with his  history of stenting and bypass surgery.  I explained this to the  patient.  Dr. Andee Lineman agreed with me that heart catheterization would be  the best route to figure out his chest pain.  Again he is reluctant to  do this.  At this point in time we will go ahead and up titrate his beta  blocker with  Toprol 75 mg a day.  We will check a CMET, fasting lipids, and BNP and  see him back in about 4-6 weeks.      Tereso Newcomer, PA-C  Electronically Signed      Learta Codding, MD,FACC  Electronically Signed   SW/MedQ  DD: 08/28/2006  DT: 08/28/2006  Job #: [id number]

## 2010-10-27 NOTE — Assessment & Plan Note (Signed)
[facility]                          [facility]ICE NOTE   NAME:Joseph Joseph [name], Joseph Joseph [name]                          MRN:          [id number]  DATE:10/08/2006                            DOB:          1961/05/29    REFERRING PHYSICIAN:  Ernestine Conrad, MD   HISTORY OF PRESENT ILLNESS:  The patient is Joseph [name] 50 year old male with Joseph [name]  history of coronary artery disease status post single-vessel coronary  artery bypass grafting in 1999.  This was followed by Joseph [name] myocardial  infarction complicated by ventricular fibrillation September 2002 when  the patient had an occluded vein graft.  The patient has an ejection  fraction that is preserved at 55%.  He saw Tereso Newcomer, PA-C in the  office recently.  He reported substernal chest pain, which has been  going on for quite Joseph [name] while.  He states he has both exertional and  resting chest pain associated with diaphoresis.  He states the pain  usually lasts on the order of several minutes.  Unfortunately, the  patient continues to smoke half Joseph [name] pack Joseph [name] day.  Joseph [name] cardiac catheterization  was recommended, but the patient declined.  He has Joseph [name] difficult social  situation, unable to go to [facility].  He also did not increase his  medical therapy as directed by Tereso Newcomer, PA-C.  The patient, today,  is found to be hypertensive, which was also documented during his last  office visit.   MEDICATIONS:  1. Xanax 1 mg p.r.n.  2. Zocor 80 mg p.o. daily.  3. Toprol XL 50 mg p.o. daily.  4. Multivitamin.  5. Vicodin.  6. Seroquel.  7. Imdur 60 mg p.o. daily.  8. Enteric-coated aspirin 325 daily.   PROBLEM LIST:  1. Chest pain, rule out angina.  2. Coronary artery disease.      Joseph [name].     Status post coronary artery bypass grafting in 1999 with Joseph [name]       vein graft to the circumflex.      b.     AMI with ventricular fibrillation arrest September 2002 with       occluded vein graft.  3. Preserved left ventricular function.  4. Mixed  dyslipidemia.  5. History of INTRAVENOUS DYE allergy.  6. Bipolar disorder.  7. Tobacco use.  8. Chronic back pain.  9. Hypertension.   ASSESSMENT AND PLAN:  1. Hydrochlorothiazide has been added to control blood pressure.  2. The patient's chest pain is concerning.  I told him that I would      recommend Joseph [name] cardiac catheterization.  The patient, however, again      stated that given his social situation, he is unable to go to      [place].  3. In the interim, we will proceed with an exercise echocardiographic      study to rule out significant ischemia.  4. The patient will follow up with [place] in 1 month.     Joseph Codding, MD,FACC     GED/MedQ  DD: 10/08/2006  DT: 10/08/2006  Job #: [id number]  cc:   Ernestine Conrad, MD

## 2011-04-15 ENCOUNTER — Other Ambulatory Visit: Payer: Self-pay | Admitting: Physician Assistant

## 2011-06-15 ENCOUNTER — Other Ambulatory Visit: Payer: Self-pay | Admitting: Physician Assistant

## 2011-08-13 ENCOUNTER — Other Ambulatory Visit: Payer: Self-pay | Admitting: Physician Assistant

## 2012-04-14 ENCOUNTER — Other Ambulatory Visit: Payer: Self-pay | Admitting: Cardiology

## 2012-07-14 ENCOUNTER — Other Ambulatory Visit: Payer: Self-pay | Admitting: Cardiology

## 2013-01-11 ENCOUNTER — Other Ambulatory Visit: Payer: Self-pay | Admitting: Cardiology

## 2016-02-20 ENCOUNTER — Ambulatory Visit: Payer: Self-pay | Admitting: Cardiovascular Disease

## 2016-08-07 ENCOUNTER — Encounter: Payer: Self-pay | Admitting: Family Medicine

## 2016-11-19 ENCOUNTER — Encounter: Payer: Self-pay | Admitting: *Deleted

## 2016-11-20 ENCOUNTER — Encounter: Payer: Self-pay | Admitting: *Deleted

## 2016-11-21 ENCOUNTER — Encounter: Payer: Self-pay | Admitting: Cardiology

## 2016-11-21 ENCOUNTER — Ambulatory Visit (INDEPENDENT_AMBULATORY_CARE_PROVIDER_SITE_OTHER): Payer: Medicare Other | Admitting: Cardiology

## 2016-11-21 ENCOUNTER — Encounter: Payer: Self-pay | Admitting: *Deleted

## 2016-11-21 VITALS — BP 119/85 | HR 87 | Ht 66.0 in | Wt 197.0 lb

## 2016-11-21 DIAGNOSIS — I25119 Atherosclerotic heart disease of native coronary artery with unspecified angina pectoris: Secondary | ICD-10-CM

## 2016-11-21 DIAGNOSIS — E782 Mixed hyperlipidemia: Secondary | ICD-10-CM | POA: Diagnosis not present

## 2016-11-21 DIAGNOSIS — R0609 Other forms of dyspnea: Secondary | ICD-10-CM | POA: Diagnosis not present

## 2016-11-21 DIAGNOSIS — Z720 Tobacco use: Secondary | ICD-10-CM

## 2016-11-21 DIAGNOSIS — I10 Essential (primary) hypertension: Secondary | ICD-10-CM | POA: Diagnosis not present

## 2016-11-21 MED ORDER — RANOLAZINE ER 500 MG PO TB12: 500.0000 mg | ORAL_TABLET | Freq: Two times a day (BID) | ORAL | 0 refills | Status: AC

## 2016-11-21 MED ORDER — RANOLAZINE ER 500 MG PO TB12: 500.0000 mg | ORAL_TABLET | Freq: Two times a day (BID) | ORAL | 3 refills | Status: DC

## 2016-11-21 NOTE — Patient Instructions (Signed)
Your physician recommends that you schedule a follow-up appointment in: 1 MONTH WITH DR MCDOWELL  Your physician has recommended you make the following change in your medication:   START RANEXA 500 MG TWICE DAILY - WE HAVE GIVEN YOU SAMPLES  Thank you for choosing [facility]!!

## 2016-11-21 NOTE — Progress Notes (Signed)
Cardiology Office Note  Date: 11/21/2016   ID: Joseph [name], DOB 1960/09/04, MRN [id number]  PCP: Ernestine Conrad, MD  Consulting Cardiologist: Nona Dell, MD   Chief Complaint  Patient presents with  . Coronary Artery Disease    History of Present Illness: Joseph [name] is a 56 y.o. male former patient of Dr. Andee Lineman last seen in 2011 and referred back to the office for cardiology consultation by Dr. Linna Darner the for assessment of known CAD. I reviewed available records and updated the chart. Records indicate recent admission to [facility] with chest pain. He ruled out for ACS with negative troponin T levels and underwent an exercise echocardiogram. This study was described as being technically difficult, reportedly with no ischemic ST segment changes, basal inferolateral hypokinesis at baseline with mid to basal inferolateral hypokinesis at peak. Results were described as "probably abnormal."  He presents today stating that over the last 4-5 months he has been having episodic shortness of breath and fatigue, no chest tightness or nitroglycerin use. He reports compliance with his medications which are outlined below. His shortness of breath certainly could be an anginal equivalent and we discussed this today.  Previous cardiac catheterization results from 2002 and 2011 are outlined below. I reviewed the results with him today.  Past Medical History:  Diagnosis Date  . CAD (coronary artery disease)    BMS to circumflex 1999, CABG with SVG to circumflex in 1999 due to in-stent restenosis  . Essential hypertension   . Mixed hyperlipidemia   . Myocardial infarction (HCC) 2002   Complicated by VF arrest, occluded SVG to OM  . Posttraumatic stress disorder     Past Surgical History:  Procedure Laterality Date  . CORONARY ARTERY BYPASS GRAFT    . HAND SURGERY Left     Current Outpatient Prescriptions  Medication Sig Dispense Refill  . ALPRAZolam (XANAX) 1 MG tablet Take  1 mg by mouth 3 (three) times daily as needed for anxiety.     Marland Kitchen amitriptyline (ELAVIL) 25 MG tablet Take 12.5-25 mg by mouth at bedtime.    Marland Kitchen aspirin EC 81 MG tablet Take 81 mg by mouth daily.    Marland Kitchen atorvastatin (LIPITOR) 20 MG tablet Take 20 mg by mouth daily.    Marland Kitchen gemfibrozil (LOPID) 600 MG tablet Take 600 mg by mouth 2 (two) times daily.    . isosorbide mononitrate (IMDUR) 60 MG 24 hr tablet Take 60 mg by mouth daily.    . metoprolol succinate (TOPROL-XL) 50 MG 24 hr tablet take 1 and 1/2 tablets by mouth once daily 25 tablet 0  . oxyCODONE-acetaminophen (PERCOCET/ROXICET) 5-325 MG tablet Take 1 tablet by mouth every 6 (six) hours as needed for severe pain.    . traZODone (DESYREL) 100 MG tablet Take 100 mg by mouth at bedtime.    . ranolazine (RANEXA) 500 MG 12 hr tablet Take 1 tablet (500 mg total) by mouth 2 (two) times daily. 14 tablet 0   No current facility-administered medications for this visit.    Allergies:  Ivp dye [iodinated diagnostic agents]   Social History: The patient  reports that he has been smoking Cigarettes.  He has never used smokeless tobacco. He reports that he does not drink alcohol or use drugs.   Family History: The patient's family history includes CAD in his mother and sister; Diabetes in his paternal grandmother; Heart disease in his paternal grandmother; Heart failure in his maternal grandmother; Hypertension in his paternal  grandmother; Unexplained death in his father.   ROS:  Please see the history of present illness. Otherwise, complete review of systems is positive for fatigue.  All other systems are reviewed and negative.   Physical Exam: VS:  BP 119/85 (BP Location: Right Arm, Cuff Size: Normal)   Pulse 87   Ht 5\' 6"  (1.676 m)   Wt 197 lb (89.4 kg)   SpO2 97%   BMI 31.80 kg/m , BMI Body mass index is 31.8 kg/m.  Wt Readings from Last 3 Encounters:  11/21/16 197 lb (89.4 kg)  04/13/10 203 lb (92.1 kg)  03/15/10 201 lb (91.2 kg)    General:  Overweight male, appears comfortable at rest. HEENT: Conjunctiva and lids normal, oropharynx clear. Neck: Supple, no elevated JVP or carotid bruits, no thyromegaly. Lungs: Clear to auscultation, nonlabored breathing at rest. Cardiac: Regular rate and rhythm, no S3 or significant systolic murmur, no pericardial rub. Abdomen: Soft, nontender, bowel sounds present, no guarding or rebound. Extremities: No pitting edema, distal pulses 2+. Skin: Warm and dry. Musculoskeletal: No kyphosis. Neuropsychiatric: Alert and oriented x3, affect grossly appropriate. 11/07/2016 which showed sinus rhythm with incomplete right bundle branch block. ECG: I personally reviewed the tracing from  Recent Labwork:  May 2018: Glucose 122, BUN 12, creatinine 1.3, LFTs normal, potassium 4.0, magnesium 2.5, troponin T less than 0.01, INR 1.0  Other Studies Reviewed Today:  Cardiac catheterization 03/04/2001: RESULTS:  Left main coronary artery had 20% discrete stenosis.  Left anterior descending artery is a large vessel.  There was a 20%  mid vessel lesion.  There was a large ramus branch without significant disease.  The circumflex coronary artery was 100% occluded proximally.  This was known previously and was occluded just before a previous stent implantation.  The right coronary artery is dominant.  There is a 20% mid vessel lesion. There were no left-to-left or right-to-left collaterals to the circumflex distribution.  Saphenous vein graft to the obtuse marginal branch was 100% occluded proximally.  Only 2 to 3 cm of the vessel was visualized and was filled with clot.  There was a very large segment of the vessel, over two-thirds of it, which was never visualized; and, again, the patient had no collaterals.  RAO VENTRICULOGRAPHY:  RAO ventriculography revealed akinesis of the inferior later base.  Ejection fraction was still in the 55% range with no MR.  Cardiac catheterization  03/21/2010: Left main normal, LAD normal, moderate sized ramus intermedius normal, occluded proximal circumflex at prior stent site with excellent bridging collaterals to the first obtuse marginal and circumflex terminating in a small posterolateral branch, 50-60% proximal to mid RCA stenosis with 40% in the mid vessel segment, LVEF approximately 55% with inferior basal hypokinesis.  Assessment and Plan:  1. Dyspnea on exertion and fatigue in the setting of known ischemic heart disease as outlined above. He was recently hospitalized [facility], ruled out for ACS, and underwent an exercise echocardiogram which looked to be suboptimal based on the report. As best I understand the interpretation, it looks like he had an area of basal inferolateral scar with worsening inferolateral hypokinesis in the mid zone suggesting scar with peri-infarct ischemia which would fit fairly well with his history. He did have moderate RCA stenosis as of last angiography in 2011, and this could also be contributing if it has progressed. Plan at this time is tan-red Ranexa 500 mg twice daily to current regimen including Toprol-XL and Imdur, then see him back in  the office for clinical follow-up. If he does not improve, he will likely need a follow-up cardiac catheterization to assess for progression in disease that would respond to revascularization.  2. Hyperlipidemia, on Lipitor 20 mg daily. He states he had lab work done during recent hospitalization which will be requested. He recalls having very high lipid numbers overall.  3. Ongoing tobacco abuse, represents another concern as far as progressive ischemic heart disease. Smoking cessation discussed.  4. Essential hypertension, systolic blood pressure adequately controlled today.  Current medicines were reviewed with the patient today.  Disposition: Follow-up in one month.  Signed, Jonelle Sidle, MD, [facility] 11/21/2016 2:11 PM    [facility] [address] [place], [place], [place] [address] Phone: [phone]; Fax: [phone]

## 2016-11-22 ENCOUNTER — Telehealth: Payer: Self-pay

## 2016-11-22 MED ORDER — ATORVASTATIN CALCIUM 80 MG PO TABS: 80.0000 mg | ORAL_TABLET | Freq: Every day | ORAL | 3 refills | Status: AC

## 2016-11-22 NOTE — Telephone Encounter (Signed)
Per Dr. Diona BrownerMcDowell patient is to increase Lipitor to 80mg  daily. Patient made aware. [facility].

## 2017-01-01 NOTE — Progress Notes (Signed)
Cardiology Office Note  Date: 01/02/2017   ID: Joseph [name], DOB 12-23-1960, MRN [id number]  PCP: Celedonio Savage, MD  Primary Cardiologist: Rozann Lesches, MD   Chief Complaint  Patient presents with  . Coronary Artery Disease    History of Present Illness: Joseph [name] is a 56 y.o. male that I met in June. He presents for a follow-up visit. Reports much better angina control since we added Ranexa to his current regimen. Still having trouble with a high heat and humidity, but his breathing is typically better when the weather breaks.  Current cardiac regimen includes aspirin, Lipitor, Imdur, Toprol-XL, and Ranexa. He does not have sublingual nitroglycerin, prescription provided.  Today we discussed continuing observation on medical therapy unless symptoms progress.  Past Medical History:  Diagnosis Date  . CAD (coronary artery disease)    BMS to circumflex 1999, CABG with SVG to circumflex in 1999 due to in-stent restenosis  . Essential hypertension   . Mixed hyperlipidemia   . Myocardial infarction ([place]) 2952   Complicated by VF arrest, occluded SVG to OM  . Posttraumatic stress disorder     Past Surgical History:  Procedure Laterality Date  . CORONARY ARTERY BYPASS GRAFT    . HAND SURGERY Left     Current Outpatient Prescriptions  Medication Sig Dispense Refill  . ALPRAZolam (XANAX) 1 MG tablet Take 1 mg by mouth 3 (three) times daily as needed for anxiety.     Marland Kitchen amitriptyline (ELAVIL) 25 MG tablet Take 12.5-25 mg by mouth at bedtime.    Marland Kitchen aspirin EC 81 MG tablet Take 81 mg by mouth daily.    Marland Kitchen atorvastatin (LIPITOR) 80 MG tablet Take 1 tablet (80 mg total) by mouth daily. (Patient taking differently: Take 40 mg by mouth daily. ) 90 tablet 3  . gemfibrozil (LOPID) 600 MG tablet Take 600 mg by mouth 2 (two) times daily.    . isosorbide mononitrate (IMDUR) 60 MG 24 hr tablet Take 60 mg by mouth daily.    . metoprolol succinate (TOPROL-XL) 50 MG 24 hr tablet take 1 and 1/2  tablets by mouth once daily 25 tablet 0  . oxyCODONE-acetaminophen (PERCOCET/ROXICET) 5-325 MG tablet Take 1 tablet by mouth every 6 (six) hours as needed for severe pain.    . ranolazine (RANEXA) 500 MG 12 hr tablet Take 1 tablet (500 mg total) by mouth 2 (two) times daily. 14 tablet 0  . tiZANidine (ZANAFLEX) 4 MG tablet Take 1-2 tablets by mouth every 8 (eight) hours as needed.  2  . traZODone (DESYREL) 100 MG tablet Take 100 mg by mouth at bedtime.    . nitroGLYCERIN (NITROSTAT) 0.4 MG SL tablet Place 1 tablet (0.4 mg total) under the tongue every 5 (five) minutes as needed for chest pain. 25 tablet 3   No current facility-administered medications for this visit.    Allergies:  Ivp dye [iodinated diagnostic agents]   Social History: The patient  reports that he quit smoking about 4 weeks ago. His smoking use included Cigarettes. He started smoking about 20 years ago. He has a 20.00 pack-year smoking history. He has never used smokeless tobacco. He reports that he does not drink alcohol or use drugs.   ROS:  Please see the history of present illness. Otherwise, complete review of systems is positive for none.  All other systems are reviewed and negative.   Physical Exam: VS:  BP (!) 136/94   Pulse 83   Ht 5' 6"  (  1.676 m)   Wt 198 lb 6.4 oz (90 kg)   BMI 32.02 kg/m , BMI Body mass index is 32.02 kg/m.  Wt Readings from Last 3 Encounters:  01/02/17 198 lb 6.4 oz (90 kg)  11/21/16 197 lb (89.4 kg)  04/13/10 203 lb (92.1 kg)    General: Overweight male, appears comfortable at rest. HEENT: Conjunctiva and lids normal, oropharynx clear. Neck: Supple, no elevated JVP or carotid bruits, no thyromegaly. Lungs: Clear to auscultation, nonlabored breathing at rest. Cardiac: Regular rate and rhythm, no S3 or significant systolic murmur, no pericardial rub. Abdomen: Soft, nontender, bowel sounds present, no guarding or rebound. Extremities: No pitting edema, distal pulses 2+. Skin: Warm and  dry. Musculoskeletal: No kyphosis. Neuropsychiatric: Alert and oriented x3, affect grossly appropriate. 11/07/2016 which showed sinus rhythm with incomplete right bundle branch block.  ECG: I personally reviewed the tracing from 11/07/2016 which showed sinus rhythm with incomplete right bundle branch block and nonspecific T-wave abnormalities.  Recent Labwork:  May 2018: Glucose 122, BUN 12, creatinine 1.3, LFTs normal, potassium 4.0, magnesium 2.5, troponin T less than 0.01, INR 1.0  Other Studies Reviewed Today:  Cardiac catheterization 03/21/2010: Left main normal, LAD normal, moderate sized ramus intermedius normal, occluded proximal circumflex at prior stent site with excellent bridging collaterals to the first obtuse marginal and circumflex terminating in a small posterolateral branch, 50-60% proximal to mid RCA stenosis with 40% in the mid vessel segment, LVEF approximately 55% with inferior basal hypokinesis.  Assessment and Plan:  1. CAD as outlined above, currently stable in terms of angina control following the addition of Ranexa. Plan is to continue present regimen and observation unless symptoms progress. He did have moderate RCA disease as of catheterization in 2011, and next step would be to consider a follow-up cardiac catheterization if his symptoms worsen. Prescription for nitroglycerin provided.  2. Hyperlipidemia, continues on Lipitor. Keep follow-up with PCP.  3. Tobacco abuse. Smoking cessation recommended.  4. Essential hypertension, no changes made present regimen.  Current medicines were reviewed with the patient today.  Disposition: Follow-up in 6 months, sooner if needed.  Signed, Satira Sark, MD, [facility] 01/02/2017 11:32 AM    [facility] [address], [place], [place] [address] Phone: [phone]; Fax: [phone]

## 2017-01-02 ENCOUNTER — Encounter: Payer: Self-pay | Admitting: Cardiology

## 2017-01-02 ENCOUNTER — Ambulatory Visit (INDEPENDENT_AMBULATORY_CARE_PROVIDER_SITE_OTHER): Payer: Medicare Other | Admitting: Cardiology

## 2017-01-02 VITALS — BP 136/94 | HR 83 | Ht 66.0 in | Wt 198.4 lb

## 2017-01-02 DIAGNOSIS — Z720 Tobacco use: Secondary | ICD-10-CM | POA: Diagnosis not present

## 2017-01-02 DIAGNOSIS — E782 Mixed hyperlipidemia: Secondary | ICD-10-CM

## 2017-01-02 DIAGNOSIS — I10 Essential (primary) hypertension: Secondary | ICD-10-CM

## 2017-01-02 DIAGNOSIS — I25119 Atherosclerotic heart disease of native coronary artery with unspecified angina pectoris: Secondary | ICD-10-CM

## 2017-01-02 MED ORDER — NITROGLYCERIN 0.4 MG SL SUBL: 0.4000 mg | SUBLINGUAL_TABLET | SUBLINGUAL | 3 refills | Status: AC | PRN

## 2017-01-02 NOTE — Patient Instructions (Signed)

## 2017-02-24 ENCOUNTER — Other Ambulatory Visit: Payer: Self-pay | Admitting: Cardiology

## 2017-04-05 ENCOUNTER — Encounter: Payer: Self-pay | Admitting: Cardiology

## 2017-04-15 ENCOUNTER — Encounter: Payer: Self-pay | Admitting: Family Medicine

## 2017-04-15 ENCOUNTER — Ambulatory Visit (INDEPENDENT_AMBULATORY_CARE_PROVIDER_SITE_OTHER): Payer: Medicare Other | Admitting: Family Medicine

## 2017-04-15 VITALS — BP 130/78 | HR 77 | Temp 98.0°F | Resp 16 | Ht 66.0 in | Wt 198.8 lb

## 2017-04-15 DIAGNOSIS — F4310 Post-traumatic stress disorder, unspecified: Secondary | ICD-10-CM

## 2017-04-15 DIAGNOSIS — I10 Essential (primary) hypertension: Secondary | ICD-10-CM | POA: Diagnosis not present

## 2017-04-15 DIAGNOSIS — Z79899 Other long term (current) drug therapy: Secondary | ICD-10-CM | POA: Diagnosis not present

## 2017-04-15 DIAGNOSIS — I25118 Atherosclerotic heart disease of native coronary artery with other forms of angina pectoris: Secondary | ICD-10-CM

## 2017-04-15 DIAGNOSIS — G894 Chronic pain syndrome: Secondary | ICD-10-CM | POA: Diagnosis not present

## 2017-04-15 DIAGNOSIS — R5383 Other fatigue: Secondary | ICD-10-CM | POA: Diagnosis not present

## 2017-04-15 DIAGNOSIS — E782 Mixed hyperlipidemia: Secondary | ICD-10-CM | POA: Diagnosis not present

## 2017-04-15 DIAGNOSIS — I2510 Atherosclerotic heart disease of native coronary artery without angina pectoris: Secondary | ICD-10-CM | POA: Insufficient documentation

## 2017-04-15 NOTE — Progress Notes (Signed)
Patient ID: Joseph [name], male    DOB: Mar 14, 1961, 56 y.o.   MRN: [id number]  Chief Complaint  Patient presents with  . Hyperlipidemia  . Hypertension    Allergies Ivp dye [iodinated diagnostic agents]  Subjective:   Joseph [name] is a 56 y.o. male who presents to [facility] today.  HPI Here to establish care as a new patient. Reports that is on disability for PTSD, CAD, back pain, and left hand injury. 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 Lives in [place]. Reports that his other PCP, Dr. Jerrel Ivory, kept him going and took care of his problems. Sees his Cardiologist/Dr. Diona Browner.   Has been followed at [facility] in the past by Dr. Alton Revere. Had therapy at [facility] for over four years for PTSD/anxiety.The switched to where  Dr. Jerrel Ivory gave him the medications for anxiety after he finished up with [facility]. Reports that used to be on seroquel and trazodone. Then just on the xanax.   Reports that Dr. Jerrel Ivory has given him pain medications for over ten years for pain so that he can walk fine. Reports that has used the percocet, four times a day, for years. Reports that this pain medication allows him to walk at least a mile a day so that he can stay limber. Reports that he has been to the pain clinic in the past and does not really want to go. Would like his pain medication refilled. Reports that he also uses tramadol for pain at times. Has had a microdiskectomy for back in the past. Reports that is pain is unchanged. Does not have any new pain symtpoms.  Reports that has been on xanax for four times a day until August 2018 when he could not get refills and he came off the medication. Then reports that he came off the pills and only use them prn now. Reports that coming off the xanax was very hard for him and was terrible. Reports that he does not want to get addicted to them again.         Past Medical History:  Diagnosis Date  . CAD (coronary artery disease)    BMS  to circumflex 1999, CABG with SVG to circumflex in 1999 due to in-stent restenosis  . Essential hypertension   . Mixed hyperlipidemia   . Myocardial infarction ([facility]) 2002   Complicated by VF arrest, occluded SVG to OM  . Posttraumatic stress disorder     Past Surgical History:  Procedure Laterality Date  . BACK SURGERY    . CAROTID STENT    . CORONARY ARTERY BYPASS GRAFT    . HAND SURGERY Left     Family History  Problem Relation Age of Onset  . CAD Mother   . Early death Mother   . Stroke Mother   . Heart disease Mother   . CAD Sister   . Unexplained death Father   . Heart failure Maternal Grandmother   . Hypertension Paternal Grandmother   . Heart disease Paternal Grandmother   . Diabetes Paternal Grandmother   . Stroke Brother   . Hyperlipidemia Brother      Social History   Socioeconomic History  . Marital status: Married    Spouse name: None  . Number of children: None  . Years of education: None  . Highest education level: None  Social Needs  . Financial resource strain: None  . Food insecurity - worry: None  . Food insecurity -  inability: None  . Transportation needs - medical: None  . Transportation needs - non-medical: None  Occupational History  . Occupation: disability  Tobacco Use  . Smoking status: Former Smoker    Packs/day: 1.00    Years: 20.00    Pack years: 20.00    Types: Cigarettes    Start date: 11/20/1996    Last attempt to quit: 12/03/2016    Years since quitting: 0.3  . Smokeless tobacco: Never Used  Substance and Sexual Activity  . Alcohol use: No  . Drug use: No  . Sexual activity: None  Other Topics Concern  . None  Social History Narrative   [place] in [place], [place]   Separated.    Has a daughter.    Eats all food groups.   Does computer things for fun.   Wears seatbelt.    Enjoys reading, movies, walking.    Current Outpatient Medications on File Prior to Visit  Medication Sig Dispense Refill  . ALPRAZolam (XANAX) 1 MG  tablet Take 1 mg by mouth 3 (three) times daily as needed for anxiety.     Marland Kitchen. amitriptyline (ELAVIL) 25 MG tablet Take 12.5-25 mg by mouth at bedtime.    Marland Kitchen. aspirin EC 81 MG tablet Take 81 mg by mouth daily.    Marland Kitchen. gemfibrozil (LOPID) 600 MG tablet Take 600 mg by mouth 2 (two) times daily.    . isosorbide mononitrate (IMDUR) 60 MG 24 hr tablet Take 60 mg by mouth daily.    . metoprolol succinate (TOPROL-XL) 50 MG 24 hr tablet take 1 and 1/2 tablets by mouth once daily 25 tablet 0  . ranolazine (RANEXA) 500 MG 12 hr tablet Take 1 tablet (500 mg total) by mouth 2 (two) times daily. 14 tablet 0  . traZODone (DESYREL) 100 MG tablet Take 100 mg by mouth at bedtime.    Marland Kitchen. atorvastatin (LIPITOR) 80 MG tablet Take 1 tablet (80 mg total) by mouth daily. (Patient taking differently: Take 40 mg by mouth daily. ) 90 tablet 3  . cloNIDine (CATAPRES) 0.1 MG tablet TAKE ONE TABLET BY MOUTH TWICE DAILY AS NEEDED FOR HIGH BLOODPRESSURE  0  . nitroGLYCERIN (NITROSTAT) 0.4 MG SL tablet Place 1 tablet (0.4 mg total) under the tongue every 5 (five) minutes as needed for chest pain. 25 tablet 3  . oxyCODONE-acetaminophen (PERCOCET/ROXICET) 5-325 MG tablet Take 1 tablet by mouth every 6 (six) hours as needed for severe pain.    Marland Kitchen. RANEXA 500 MG 12 hr tablet TAKE ONE TABLET BY MOUTH TWICE DAILY (Patient not taking: Reported on 04/15/2017) 60 tablet 3  . tiZANidine (ZANAFLEX) 4 MG tablet Take 1-2 tablets by mouth every 8 (eight) hours as needed.  2  . traMADol (ULTRAM) 50 MG tablet Take 50 mg every 6 (six) hours as needed by mouth. for pain  0   No current facility-administered medications on file prior to visit.      Review of Systems  Constitutional: Positive for fatigue. Negative for fever and unexpected weight change.       Reports that over the past several months has been more tired that he used to feel. Reports that his mood.   Respiratory: Negative for cough and chest tightness.   Cardiovascular: Negative for chest  pain, palpitations and leg swelling.     Objective:   BP 130/78 (BP Location: Left Arm, Patient Position: Sitting, Cuff Size: Normal)   Pulse 77   Temp 98 F (36.7 C) (Other (Comment))  Resp 16   Ht 5\' 6"  (1.676 m)   Wt 198 lb 12 oz (90.2 kg)   SpO2 97%   BMI 32.08 kg/m   Physical Exam  Constitutional: He is oriented to person, place, and time. He appears well-developed and well-nourished.  HENT:  Head: Normocephalic and atraumatic.  Eyes: EOM are normal. Pupils are equal, round, and reactive to light.  Neck: Normal range of motion.  Cardiovascular: Normal rate, regular rhythm and normal heart sounds.  Pulmonary/Chest: Effort normal and breath sounds normal. No respiratory distress.  Abdominal: Soft. Bowel sounds are normal. He exhibits no distension.  Neurological: He is alert and oriented to person, place, and time. No cranial nerve deficit.  Skin: Skin is warm and dry.  Psychiatric: His behavior is normal. Thought content normal.  Mood slightly blunted but patient does smile several times during exam visit.  Memory appears intact to immediate recent and remote recall.  Judgment seems fairly normal.  Speech somewhat slow but not slurred.  Vitals reviewed.    Assessment and Plan  1. Essential hypertension Lifestyle modifications discussed with patient including a diet emphasizing vegetables, fruits, and whole grains. Limiting intake of sodium to less than 2,400 mg per day.  Recommendations discussed include consuming low-fat dairy products, poultry, fish, legumes, non-tropical vegetable oils, and nuts; and limiting intake of sweets, sugar-sweetened beverages, and red meat. Discussed following a plan such as the Dietary Approaches to Stop Hypertension (DASH) diet. Patient to read up on this diet.  Continue  Medications, BP well controlled.  - Basic metabolic panel  2. HYPERLIPIDEMIA, MIXED Check labs. Needs statins due to history of CAD.  - Basic metabolic panel -  Hemoglobin A1c - Lipid panel  3. PTSD/Anxiety Reports that has a long history of anxiety and PTSD. Reports that he is basically off the xanax now. Does not want referral to psychiatry now or does not want any treatment for anxiety, reports that he feels stable at this time. Denies any mood disorders or trouble at this time.   4. High risk medication use Check labs.  - Hepatic function panel  5. Atherosclerosis of native coronary artery of native heart with stable angina pectoris [facility]) Keep scheduled visits with Cardiology and medications as directed.   6. Chronic pain syndrome Discussed with patient that I would not fill his percocet or xanax. He reports that he is ok not to have the percocet. He defers to follow up with pain clinic or evaluation for back pain at this time. We discussed the risk of pain medications and we discussed the risk of not evaluating pain and having a diagnosis and just covering up pain with medications. He voiced understanding. Defers evaluation/work up at this time. Reports that he will be ok with out the pain medication.   7. Fatigue, unspecified type Discussed possible etiologies. Follow up after lab testing. Bring in all medications to visit.  - CBC with Differential/Platelet - TSH OV greater than 45 min.  Defers screen for HIV/RPR/Hepatitis panel.  Records from ED visits/Immunization records from Dr. Jerrel Ivory.  Defers influenza shot.  Patient reportedly denies tobacco use however he does smell of cigarette smoke and reports he has not been around anyone who smokes.  He was counseled concerning the risks of tobacco smoke. Return in about 2 months (around 06/15/2017). Aliene Beams, MD 04/15/2017

## 2017-05-10 ENCOUNTER — Encounter: Payer: Self-pay | Admitting: Cardiology

## 2017-05-30 ENCOUNTER — Other Ambulatory Visit: Payer: Self-pay

## 2017-05-30 ENCOUNTER — Encounter: Payer: Self-pay | Admitting: Family Medicine

## 2017-05-30 ENCOUNTER — Other Ambulatory Visit: Payer: Self-pay | Admitting: Family Medicine

## 2017-05-30 MED ORDER — ISOSORBIDE MONONITRATE ER 60 MG PO TB24: 60.0000 mg | ORAL_TABLET | Freq: Every day | ORAL | 0 refills | Status: DC

## 2017-05-30 MED ORDER — RANOLAZINE ER 500 MG PO TB12: 500.0000 mg | ORAL_TABLET | Freq: Two times a day (BID) | ORAL | 3 refills | Status: AC

## 2017-05-30 MED ORDER — METOPROLOL SUCCINATE ER 50 MG PO TB24: 50.0000 mg | ORAL_TABLET | Freq: Every day | ORAL | 0 refills | Status: AC

## 2017-05-30 MED ORDER — TRAZODONE HCL 100 MG PO TABS: 100.0000 mg | ORAL_TABLET | Freq: Every day | ORAL | 0 refills | Status: AC

## 2017-06-03 ENCOUNTER — Other Ambulatory Visit: Payer: Self-pay | Admitting: Family Medicine

## 2017-06-06 NOTE — Telephone Encounter (Signed)
Should come from his psychiatrist. Please call and confirm. Joseph Limboachel H. Tracie HarrierHagler, MD

## 2017-06-06 NOTE — Telephone Encounter (Signed)
Called patient regarding message below. No answer, left generic message for patient to return call.   

## 2017-06-10 NOTE — Telephone Encounter (Signed)
Called patient regarding message below. No answer, unable to leave message.  

## 2017-06-19 ENCOUNTER — Ambulatory Visit: Payer: Medicare Other | Admitting: Family Medicine

## 2017-07-01 NOTE — Progress Notes (Deleted)
Cardiology Office Note  Date: 07/01/2017   ID: Joseph [name], DOB 12/08/1960, MRN [id number]  PCP: Aliene Beams, MD  Primary Cardiologist: Nona Dell, MD   No chief complaint on file.   History of Present Illness: Joseph [name] is a 57 y.o. male last seen in July 2018.  Records indicate ER visit at [facility] in October 2018 with elevated blood pressure, documented at 180/120 during that visit. He had no evidence of ACS at that time by troponin T level. He was treated with labetalol and clonidine, there is also concerned mentioned in the note about noncompliance with regular medical therapy.  Past Medical History:  Diagnosis Date  . CAD (coronary artery disease)    BMS to circumflex 1999, CABG with SVG to circumflex in 1999 due to in-stent restenosis  . Essential hypertension   . Mixed hyperlipidemia   . Myocardial infarction ([facility]) 2002   Complicated by VF arrest, occluded SVG to OM  . Posttraumatic stress disorder     Past Surgical History:  Procedure Laterality Date  . BACK SURGERY    . CAROTID STENT    . CORONARY ARTERY BYPASS GRAFT    . HAND SURGERY Left     Current Outpatient Medications  Medication Sig Dispense Refill  . ALPRAZolam (XANAX) 1 MG tablet Take 1 mg by mouth 3 (three) times daily as needed for anxiety.     Marland Kitchen amitriptyline (ELAVIL) 25 MG tablet Take 12.5-25 mg by mouth at bedtime.    Marland Kitchen aspirin EC 81 MG tablet Take 81 mg by mouth daily.    Marland Kitchen atorvastatin (LIPITOR) 80 MG tablet Take 1 tablet (80 mg total) by mouth daily. (Patient taking differently: Take 40 mg by mouth daily. ) 90 tablet 3  . cloNIDine (CATAPRES) 0.1 MG tablet TAKE ONE TABLET BY MOUTH TWICE DAILY AS NEEDED FOR HIGH BLOODPRESSURE  0  . gemfibrozil (LOPID) 600 MG tablet Take 600 mg by mouth 2 (two) times daily.    . isosorbide mononitrate (IMDUR) 60 MG 24 hr tablet Take 1 tablet (60 mg total) by mouth daily. 30 tablet 0  . metoprolol succinate (TOPROL-XL) 50 MG 24 hr  tablet Take 1 tablet (50 mg total) by mouth daily. Take with or immediately following a meal. 30 tablet 0  . nitroGLYCERIN (NITROSTAT) 0.4 MG SL tablet Place 1 tablet (0.4 mg total) under the tongue every 5 (five) minutes as needed for chest pain. 25 tablet 3  . oxyCODONE-acetaminophen (PERCOCET/ROXICET) 5-325 MG tablet Take 1 tablet by mouth every 6 (six) hours as needed for severe pain.    . ranolazine (RANEXA) 500 MG 12 hr tablet Take 1 tablet (500 mg total) by mouth 2 (two) times daily. 14 tablet 0  . ranolazine (RANEXA) 500 MG 12 hr tablet Take 1 tablet (500 mg total) by mouth 2 (two) times daily. 60 tablet 3  . tiZANidine (ZANAFLEX) 4 MG tablet Take 1-2 tablets by mouth every 8 (eight) hours as needed.  2  . traMADol (ULTRAM) 50 MG tablet Take 50 mg every 6 (six) hours as needed by mouth. for pain  0  . traZODone (DESYREL) 100 MG tablet Take 1 tablet (100 mg total) by mouth at bedtime. 30 tablet 0   No current facility-administered medications for this visit.    Allergies:  Ivp dye [iodinated diagnostic agents]   Social History: The patient  reports that he quit smoking about 6 months ago. His smoking use included cigarettes. He started  smoking about 20 years ago. He has a 20.00 pack-year smoking history. he has never used smokeless tobacco. He reports that he does not drink alcohol or use drugs.   Family History: The patient's family history includes CAD in his mother and sister; Diabetes in his paternal grandmother; Early death in his mother; Heart disease in his mother and paternal grandmother; Heart failure in his maternal grandmother; Hyperlipidemia in his brother; Hypertension in his paternal grandmother; Stroke in his brother and mother; Unexplained death in his father.   ROS:  Please see the history of present illness. Otherwise, complete review of systems is positive for {NONE DEFAULTED:18576::"none"}.  All other systems are reviewed and negative.   Physical Exam: VS:  There were  no vitals taken for this visit., BMI There is no height or weight on file to calculate BMI.  Wt Readings from Last 3 Encounters:  04/15/17 198 lb 12 oz (90.2 kg)  01/02/17 198 lb 6.4 oz (90 kg)  11/21/16 197 lb (89.4 kg)    General: Patient appears comfortable at rest. HEENT: Conjunctiva and lids normal, oropharynx clear with moist mucosa. Neck: Supple, no elevated JVP or carotid bruits, no thyromegaly. Lungs: Clear to auscultation, nonlabored breathing at rest. Cardiac: Regular rate and rhythm, no S3 or significant systolic murmur, no pericardial rub. Abdomen: Soft, nontender, no hepatomegaly, bowel sounds present, no guarding or rebound. Extremities: No pitting edema, distal pulses 2+. Skin: Warm and dry. Musculoskeletal: No kyphosis. Neuropsychiatric: Alert and oriented x3, affect grossly appropriate.  ECG: I personally reviewed the tracing from 11/07/2016 which showed sinus bradycardia with incomplete right bundle branch block. Follow-up tracing from 03/30/2017 showed sinus rhythm with incomplete right bundle branch block and nonspecific ST changes.  Recent Labwork:  May 2018: Glucose 122, BUN 12, creatinine 1.3, LFTs normal, potassium 4.0, magnesium 2.5, troponin T less than 0.01, INR 1.0 October 2018: Potassium 3.6, BUN 12, creatinine 0.85, AST 15, ALT 12, magnesium 1.7, troponin T less than 0.01, hemoglobin 14.1, platelets 311  Other Studies Reviewed Today:  Cardiac catheterization 03/21/2010: Left main normal, LAD normal, moderate sized ramus intermedius normal, occluded proximal circumflex at prior stent site with excellent bridging collaterals to the first obtuse marginal and circumflex terminating in a small posterolateral branch, 50-60% proximal to mid RCA stenosis with 40% in the mid vessel segment, LVEF approximately 55% with inferior basal hypokinesis.  Assessment and Plan:    Current medicines were reviewed with the patient today.  No orders of the defined types  were placed in this encounter.   Disposition:  Signed, Jonelle SidleSamuel G. McDowell, MD, [facility] 07/01/2017 1:51 PM    [facility] [place] [address], [place], [place] [address] Phone: [phone]; Fax: [phone]

## 2017-07-02 ENCOUNTER — Encounter: Payer: Self-pay | Admitting: Family Medicine

## 2017-07-02 ENCOUNTER — Ambulatory Visit (INDEPENDENT_AMBULATORY_CARE_PROVIDER_SITE_OTHER): Payer: Medicare Other | Admitting: Family Medicine

## 2017-07-02 ENCOUNTER — Other Ambulatory Visit: Payer: Self-pay | Admitting: Family Medicine

## 2017-07-02 VITALS — BP 140/90 | HR 105 | Temp 98.1°F | Resp 16 | Ht 66.0 in | Wt 193.2 lb

## 2017-07-02 DIAGNOSIS — M545 Low back pain, unspecified: Secondary | ICD-10-CM

## 2017-07-02 DIAGNOSIS — G8929 Other chronic pain: Secondary | ICD-10-CM | POA: Diagnosis not present

## 2017-07-02 DIAGNOSIS — R499 Unspecified voice and resonance disorder: Secondary | ICD-10-CM

## 2017-07-02 DIAGNOSIS — Z23 Encounter for immunization: Secondary | ICD-10-CM

## 2017-07-02 DIAGNOSIS — F419 Anxiety disorder, unspecified: Secondary | ICD-10-CM | POA: Diagnosis not present

## 2017-07-02 DIAGNOSIS — F4312 Post-traumatic stress disorder, chronic: Secondary | ICD-10-CM | POA: Diagnosis not present

## 2017-07-02 NOTE — Progress Notes (Signed)
Patient ID: Joseph [name], male    DOB: 11-Nov-1960, 57 y.o.   MRN: [id number]  Chief Complaint  Patient presents with  . Follow-up    Allergies Ivp dye [iodinated diagnostic agents]  Subjective:   Joseph [name] is a 57 y.o. male who presents to [facility] today.  HPI Joseph [name] presents today for follow-up.  He reports that he is here to have his blood pressure rechecked.  He reports that he also has concerned that his voice is different over approximately the past several months.  He does have a history of tobacco use and reports that he does smoke cigarettes on and off but has tried to cut down on his use.  He is very vague about the amount that he smokes.  He reports he is concerned about the change in his voice and that even his computer voice recognition where does not recognize voice.  He does not have any weight loss, night sweats, shortness of breath, or reflux symptoms.  He reports that he has not been doing great.  He reports that when he was being followed by Dr. Jerrel Ivory that his mood and pain was under good control.  He reports that since transferring his care to our office that he has been off seroquel.  He reports that Dr. Jerrel Ivory gave him Xanax 1 mg 4 times a day.  He was also on trazodone and Seroquel.  He reports that because he would not agree to see psychiatry at her last visit and I would not agree to give him his medications that he is not been doing very well.  He reports that he does not want to see psychiatrist.  Reports that he has seen Dr. Vonita Moss in the past.  In many different psychiatrists and therapists.  Reports he was diagnosed with anxiety and PTSD. Then was transferred to the [facility] center and saw psychologist and tried lots of medications. Was placed on seroquel. Gets anxiety, panic feeling, trouble with being in a crowd. The xanax did not get rid of the feelings but it made it possible for him to deal with it again. Does not want to see anyone in  mental health. Reports that does not want to talk to anyone about his mood issues. Reports that was on seroquel and it was because of vivid dreams and then would use the trazodone at night for sleep. Reports that had been on the xanax qid.  Reports that later Xanax is decreased to 3 times a day then prior PCP added a muscle relaxer.  He feels like he did much better on these medications and functioned better.  He reports he is also not been doing as well because I have not given him his Percocet and his tramadol.  He is not interested in seeing anyone at a pain clinic.  Reports he has chronic pain in his back and does not want to go through shots or any other treatment.  He reports that he would just like to have his pain medications.  He reports he has never abused his medication and just needs to take them on a daily basis and he feels better.  Reports that he has chronic pain in his lower back that radiates down both of his legs to his feet.  Reports that he has chronic pain in his arm where he had a crush injury in the past.  Can use now only tylenol for pain. Takes tylenol. Has been to  pain clinic in the past. Has had injections in back. Has had physical therapy. Had been on percocet for over 10 years.   Is here to get his blood pressure checked and followed up on.  Reports that he believes that the sugar had been elevating his blood pressure and so has cut back on that.  He also reports that having to discuss his medications and issues today makes him very anxious.    Past Medical History:  Diagnosis Date  . CAD (coronary artery disease)    BMS to circumflex 1999, CABG with SVG to circumflex in 1999 due to in-stent restenosis  . Essential hypertension   . Mixed hyperlipidemia   . Myocardial infarction (HCC) 2002   Complicated by VF arrest, occluded SVG to OM  . Posttraumatic stress disorder     Past Surgical History:  Procedure Laterality Date  . BACK SURGERY    . CAROTID STENT    .  CORONARY ARTERY BYPASS GRAFT    . HAND SURGERY Left     Family History  Problem Relation Age of Onset  . CAD Mother   . Early death Mother   . Stroke Mother   . Heart disease Mother   . CAD Sister   . Unexplained death Father   . Heart failure Maternal Grandmother   . Hypertension Paternal Grandmother   . Heart disease Paternal Grandmother   . Diabetes Paternal Grandmother   . Stroke Brother   . Hyperlipidemia Brother      Social History   Socioeconomic History  . Marital status: Married    Spouse name: None  . Number of children: None  . Years of education: None  . Highest education level: None  Social Needs  . Financial resource strain: None  . Food insecurity - worry: None  . Food insecurity - inability: None  . Transportation needs - medical: None  . Transportation needs - non-medical: None  Occupational History  . Occupation: disability  Tobacco Use  . Smoking status: Former Smoker    Packs/day: 1.00    Years: 20.00    Pack years: 20.00    Types: Cigarettes    Start date: 11/20/1996    Last attempt to quit: 12/03/2016    Years since quitting: 0.5  . Smokeless tobacco: Never Used  Substance and Sexual Activity  . Alcohol use: No  . Drug use: No  . Sexual activity: None  Other Topics Concern  . None  Social History Narrative   [place] in [place], [place]   Separated.    Has a daughter.    Eats all food groups.   Does computer things for fun.   Wears seatbelt.    Enjoys reading, movies, walking.    Current Outpatient Medications on File Prior to Visit  Medication Sig Dispense Refill  . amitriptyline (ELAVIL) 25 MG tablet Take 12.5-25 mg by mouth at bedtime.    Marland Kitchen. aspirin EC 81 MG tablet Take 81 mg by mouth daily.    . cloNIDine (CATAPRES) 0.1 MG tablet TAKE ONE TABLET BY MOUTH TWICE DAILY AS NEEDED FOR HIGH BLOODPRESSURE  0  . gemfibrozil (LOPID) 600 MG tablet Take 600 mg by mouth 2 (two) times daily.    . isosorbide mononitrate (IMDUR) 60 MG 24 hr tablet Take  1 tablet (60 mg total) by mouth daily. 30 tablet 0  . metoprolol succinate (TOPROL-XL) 50 MG 24 hr tablet Take 1 tablet (50 mg total) by mouth daily. Take with or immediately following  a meal. 30 tablet 0  . oxyCODONE-acetaminophen (PERCOCET/ROXICET) 5-325 MG tablet Take 1 tablet by mouth every 6 (six) hours as needed for severe pain.    . ranolazine (RANEXA) 500 MG 12 hr tablet Take 1 tablet (500 mg total) by mouth 2 (two) times daily. 14 tablet 0  . tiZANidine (ZANAFLEX) 4 MG tablet Take 1-2 tablets by mouth every 8 (eight) hours as needed.  2  . traZODone (DESYREL) 100 MG tablet Take 1 tablet (100 mg total) by mouth at bedtime. 30 tablet 0  . ALPRAZolam (XANAX) 1 MG tablet Take 1 mg by mouth 3 (three) times daily as needed for anxiety.     Marland Kitchen atorvastatin (LIPITOR) 80 MG tablet Take 1 tablet (80 mg total) by mouth daily. (Patient taking differently: Take 40 mg by mouth daily. ) 90 tablet 3  . nitroGLYCERIN (NITROSTAT) 0.4 MG SL tablet Place 1 tablet (0.4 mg total) under the tongue every 5 (five) minutes as needed for chest pain. 25 tablet 3  . ranolazine (RANEXA) 500 MG 12 hr tablet Take 1 tablet (500 mg total) by mouth 2 (two) times daily. (Patient not taking: Reported on 07/02/2017) 60 tablet 3  . traMADol (ULTRAM) 50 MG tablet Take 50 mg every 6 (six) hours as needed by mouth. for pain  0   No current facility-administered medications on file prior to visit.      Review of Systems  Constitutional: Positive for fatigue. Negative for chills, diaphoresis and fever.  HENT: Positive for voice change. Negative for congestion, dental problem, mouth sores, postnasal drip, rhinorrhea, sinus pressure, sinus pain, sneezing, sore throat and trouble swallowing.   Respiratory: Negative for cough, chest tightness and shortness of breath.   Cardiovascular: Negative for chest pain, palpitations and leg swelling.  Gastrointestinal: Negative for abdominal pain.  Musculoskeletal: Positive for back pain.    Skin: Negative for rash.  Psychiatric/Behavioral: Positive for sleep disturbance. Negative for behavioral problems, decreased concentration, dysphoric mood, hallucinations and suicidal ideas. The patient is nervous/anxious.      Objective:   BP 140/90   Pulse (!) 105   Temp 98.1 F (36.7 C) (Temporal)   Resp 16   Ht 5\' 6"  (1.676 m)   Wt 193 lb 4 oz (87.7 kg)   SpO2 97%   BMI 31.19 kg/m   Physical Exam  Constitutional: He is oriented to person, place, and time. He appears well-developed and well-nourished.  HENT:  Head: Normocephalic and atraumatic.  Eyes: EOM are normal. Pupils are equal, round, and reactive to light.  Neck: Normal range of motion. Neck supple.  Cardiovascular: Normal rate, regular rhythm and normal heart sounds.  Pulmonary/Chest: Effort normal and breath sounds normal.  Musculoskeletal: He exhibits no edema.  Neurological: He is alert and oriented to person, place, and time. No cranial nerve deficit.  Skin: Skin is warm, dry and intact.  Psychiatric: His speech is normal. Thought content normal. His mood appears anxious. His affect is blunt. He is slowed. Thought content is not paranoid and not delusional. Cognition and memory are normal. He expresses no homicidal and no suicidal ideation. He expresses no suicidal plans and no homicidal plans.  Vitals reviewed.   Depression screen PHQ 2/9 04/15/2017  Decreased Interest 0  Down, Depressed, Hopeless 0  PHQ - 2 Score 0   He refuses to complete the PHQ. He reports he knows what test I am doing and does not want to do it.  Assessment and Plan   Long discussion with  patient today in office regarding his chronic use of benzodiazepines and narcotic pain medications.  I did discuss with patient that since he had transferred his care to our office that my treatment is based upon my recommendation and my judgment and not on what other providers have given him in the past.  I did discuss with patient that if he did not  want to follow the recommended treatment plan that he might want to consider getting another primary care physician who would agree with his reasoning.  He told me that he did not agree with my reasoning and did not understand why the benzodiazepines and opioids were a problem.  I did discuss with patient today that I would be willing to prescribe him his Seroquel and his trazodone until he got in with the psychiatrist.  He reports if he cannot have his Xanax that he does not want to take the Seroquel or the trazodone.  After a very long discussion regarding why I believe this is the best course of treatment for his mental health, he has agreed to go see Dr. Vanetta Shawl for a one-time consultation. In addition, we discussed his pain management.  He is currently using Tylenol over-the-counter to deal with his pain.  He does request his narcotics today and does not understand why he cannot be given them.  He reports that he has never displayed any drug-seeking behavior or misused his opioids.  He does not see why he cannot receive these medications.  I discussed with him in detail that he can receive these medications but he needs to receive them from a chronic pain medication specialist/from a pain medicine clinic.  He initially was not willing to go see this physician/group.  However after multiple times of me reiterating that I will not give him these medications he would like for me to place a referral for him to go see pain management.  Mr. Ruffins did relate to me that he feels like a doctor's refusal to give medications that the patient needs and would benefit from means that they do not care.  I told him that I do care about his well-being.  I told him that I cared about his mental and physical health and therefore that is why I would not give him high doses of medications which I feel could cause him harm.  1. Change in voice Referral to ENT for evaluation.  Patient does not believe his symptoms are secondary  to reflux.  We will seek evaluation and laryngoscopy prior to treatment. - Ambulatory referral to ENT  2. Chronic low back pain without sciatica, unspecified back pain laterality Furl to pain clinic placed. - Ambulatory referral to Pain Clinic  3. Anxiety Patient unwilling to go back on Seroquel or trazodone without the refill of Xanax.  He does not display suicidal or homicidal ideations.  He is not a threat to himself or anyone else at this time.  He was counseled that if his mood changed or he developed any thoughts of being a harm to himself or anyone else to please contact medical help.  He voiced understanding. - Ambulatory referral to Psychiatry  4. Chronic post-traumatic stress disorder (PTSD)  - Ambulatory referral to Psychiatry  5. Immunization due  - Flu Vaccine QUAD 6+ mos PF IM (Fluarix Quad PF) Patient reports that he believes his blood pressure is elevated today because of the conversation that he just had.  He reports that having discussions like this make him anxious  and are difficult to discuss.  He does wish to continue being a patient in our office and does not want to transfer his care to another office.  I told him that I am agreeable to take care of his medical needs but would not be prescribing him the medications that we discussed today.  Is recommended that he follow-up in 1 month to recheck his blood pressure. He did not get his labs at his office visit.  He was asked to get his labs performed today, and follow-up in 1 month for blood pressure check and to discuss his labs. OV 25 minutes. Greater than 50% spent counseling on above.  Return in about 4 weeks (around 07/30/2017) for follow up. Aliene Beams, MD 07/02/2017

## 2017-07-03 ENCOUNTER — Ambulatory Visit: Payer: Medicare Other | Admitting: Cardiology

## 2017-07-03 LAB — HEMOGLOBIN A1C
Hgb A1c MFr Bld: 5.1 % of total Hgb (ref <5.7)
Mean Plasma Glucose: 100 (calc)
eAG (mmol/L): 5.5 (calc)

## 2017-07-03 LAB — BASIC METABOLIC PANEL
BUN: 12 mg/dL (ref 7–25)
CO2: 27 mmol/L (ref 20–32)
CREATININE: 1.1 mg/dL (ref 0.70–1.33)
Calcium: 9.8 mg/dL (ref 8.6–10.3)
Chloride: 105 mmol/L (ref 98–110)
GLUCOSE: 103 mg/dL (ref 65–139)
Potassium: 4.2 mmol/L (ref 3.5–5.3)
Sodium: 142 mmol/L (ref 135–146)

## 2017-07-03 LAB — HEPATIC FUNCTION PANEL
AG Ratio: 1.8 (calc) (ref 1.0–2.5)
ALT: 15 U/L (ref 9–46)
AST: 15 U/L (ref 10–35)
Albumin: 4.5 g/dL (ref 3.6–5.1)
Alkaline phosphatase (APISO): 65 U/L (ref 40–115)
BILIRUBIN DIRECT: 0.1 mg/dL (ref 0.0–0.2)
BILIRUBIN INDIRECT: 0.5 mg/dL (ref 0.2–1.2)
BILIRUBIN TOTAL: 0.6 mg/dL (ref 0.2–1.2)
GLOBULIN: 2.5 g/dL (ref 1.9–3.7)
Total Protein: 7 g/dL (ref 6.1–8.1)

## 2017-07-03 LAB — LIPID PANEL
CHOL/HDL RATIO: 10.5 (calc) — AB (ref <5.0)
CHOLESTEROL: 389 mg/dL — AB (ref <200)
HDL: 37 mg/dL — AB (ref >40)
LDL Cholesterol (Calc): 288 mg/dL (calc) — ABNORMAL HIGH
Non-HDL Cholesterol (Calc): 352 mg/dL (calc) — ABNORMAL HIGH (ref <130)
TRIGLYCERIDES: 378 mg/dL — AB (ref <150)

## 2017-07-04 ENCOUNTER — Encounter: Payer: Self-pay | Admitting: Family Medicine

## 2017-07-23 ENCOUNTER — Encounter: Payer: Self-pay | Admitting: Family Medicine

## 2017-08-01 ENCOUNTER — Other Ambulatory Visit: Payer: Self-pay | Admitting: Family Medicine

## 2017-08-23 ENCOUNTER — Other Ambulatory Visit: Payer: Self-pay | Admitting: Family Medicine

## 2017-08-28 NOTE — Progress Notes (Deleted)
Psychiatric Initial Adult Assessment   Patient Identification: Joseph [name] MRN:  [id number] Date of Evaluation:  08/28/2017 Referral Source: Aliene Beams, MD Chief Complaint:   Visit Diagnosis: No diagnosis found.  History of Present Illness:   Joseph [name] is a 57 y.o. year old male with a history of PTSD, anxiety, CAD, hypertension, who is referred for anxiety. Per chart review, patient had been prescribed Xanax 1 mg TID and asked his new PCP to continue to this medication for anxiety.   Xanax, opioids  Associated Signs/Symptoms: Depression Symptoms:  {DEPRESSION SYMPTOMS:20000} (Hypo) Manic Symptoms:  {BHH MANIC SYMPTOMS:22872} Anxiety Symptoms:  {BHH ANXIETY SYMPTOMS:22873} Psychotic Symptoms:  {BHH PSYCHOTIC SYMPTOMS:22874} PTSD Symptoms: {BHH PTSD SYMPTOMS:22875}  Past Psychiatric History:  Outpatient:  Psychiatry admission:  Previous suicide attempt:  Past trials of medication:  History of violence:   Previous Psychotropic Medications: {YES/NO:21197}  Substance Abuse History in the last 12 months:  {yes no:314532}  Consequences of Substance Abuse: {BHH CONSEQUENCES OF SUBSTANCE ABUSE:22880}  Past Medical History:  Past Medical History:  Diagnosis Date  . CAD (coronary artery disease)    BMS to circumflex 1999, CABG with SVG to circumflex in 1999 due to in-stent restenosis  . Essential hypertension   . Mixed hyperlipidemia   . Myocardial infarction (HCC) 2002   Complicated by VF arrest, occluded SVG to OM  . Posttraumatic stress disorder     Past Surgical History:  Procedure Laterality Date  . BACK SURGERY    . CAROTID STENT    . CORONARY ARTERY BYPASS GRAFT    . HAND SURGERY Left     Family Psychiatric History: ***  Family History:  Family History  Problem Relation Age of Onset  . CAD Mother   . Early death Mother   . Stroke Mother   . Heart disease Mother   . CAD Sister   . Unexplained death Father   . Heart failure Maternal Grandmother   .  Hypertension Paternal Grandmother   . Heart disease Paternal Grandmother   . Diabetes Paternal Grandmother   . Stroke Brother   . Hyperlipidemia Brother     Social History:   Social History   Socioeconomic History  . Marital status: Married    Spouse name: Not on file  . Number of children: Not on file  . Years of education: Not on file  . Highest education level: Not on file  Social Needs  . Financial resource strain: Not on file  . Food insecurity - worry: Not on file  . Food insecurity - inability: Not on file  . Transportation needs - medical: Not on file  . Transportation needs - non-medical: Not on file  Occupational History  . Occupation: disability  Tobacco Use  . Smoking status: Former Smoker    Packs/day: 1.00    Years: 20.00    Pack years: 20.00    Types: Cigarettes    Start date: 11/20/1996    Last attempt to quit: 12/03/2016    Years since quitting: 0.7  . Smokeless tobacco: Never Used  Substance and Sexual Activity  . Alcohol use: No  . Drug use: No  . Sexual activity: Not on file  Other Topics Concern  . Not on file  Social History Narrative   Lives in [place], [place]   Separated.    Has a daughter.    Eats all food groups.   Does computer things for fun.   Wears seatbelt.    Enjoys reading, movies, walking.  Additional Social History: ***  Allergies:   Allergies  Allergen Reactions  . Ivp Dye [Iodinated Diagnostic Agents] Hives and Swelling    Metabolic Disorder Labs: Lab Results  Component Value Date   HGBA1C 5.1 07/02/2017   MPG 100 07/02/2017   No results found for: PROLACTIN Lab Results  Component Value Date   CHOL 389 (H) 07/02/2017   TRIG 378 (H) 07/02/2017   HDL 37 (L) 07/02/2017   CHOLHDL 10.5 (H) 07/02/2017   LDLCALC 288 (H) 07/02/2017     Current Medications: Current Outpatient Medications  Medication Sig Dispense Refill  . ALPRAZolam (XANAX) 1 MG tablet Take 1 mg by mouth 3 (three) times daily as needed for anxiety.      Marland Kitchen amitriptyline (ELAVIL) 25 MG tablet Take 12.5-25 mg by mouth at bedtime.    Marland Kitchen aspirin EC 81 MG tablet Take 81 mg by mouth daily.    Marland Kitchen atorvastatin (LIPITOR) 80 MG tablet Take 1 tablet (80 mg total) by mouth daily. (Patient taking differently: Take 40 mg by mouth daily. ) 90 tablet 3  . cloNIDine (CATAPRES) 0.1 MG tablet TAKE ONE TABLET BY MOUTH TWICE DAILY AS NEEDED FOR HIGH BLOODPRESSURE  0  . gemfibrozil (LOPID) 600 MG tablet Take 600 mg by mouth 2 (two) times daily.    . hydrOXYzine (ATARAX/VISTARIL) 25 MG tablet TAKE ONE TABLET BY MOUTH DAILY AS NEEDED 30 tablet 0  . isosorbide mononitrate (IMDUR) 60 MG 24 hr tablet TAKE ONE TABLET BY MOUTH EVERY DAY 30 tablet 0  . metoprolol succinate (TOPROL-XL) 50 MG 24 hr tablet Take 1 tablet (50 mg total) by mouth daily. Take with or immediately following a meal. 30 tablet 0  . nitroGLYCERIN (NITROSTAT) 0.4 MG SL tablet Place 1 tablet (0.4 mg total) under the tongue every 5 (five) minutes as needed for chest pain. 25 tablet 3  . oxyCODONE-acetaminophen (PERCOCET/ROXICET) 5-325 MG tablet Take 1 tablet by mouth every 6 (six) hours as needed for severe pain.    . ranolazine (RANEXA) 500 MG 12 hr tablet Take 1 tablet (500 mg total) by mouth 2 (two) times daily. 14 tablet 0  . ranolazine (RANEXA) 500 MG 12 hr tablet Take 1 tablet (500 mg total) by mouth 2 (two) times daily. (Patient not taking: Reported on 07/02/2017) 60 tablet 3  . tiZANidine (ZANAFLEX) 4 MG tablet Take 1-2 tablets by mouth every 8 (eight) hours as needed.  2  . traMADol (ULTRAM) 50 MG tablet Take 50 mg every 6 (six) hours as needed by mouth. for pain  0  . traZODone (DESYREL) 100 MG tablet Take 1 tablet (100 mg total) by mouth at bedtime. 30 tablet 0   No current facility-administered medications for this visit.     Neurologic: Headache: No Seizure: No Paresthesias:No  Musculoskeletal: Strength & Muscle Tone: within normal limits Gait & Station: normal Patient leans:  N/A  Psychiatric Specialty Exam: ROS  There were no vitals taken for this visit.There is no height or weight on file to calculate BMI.  General Appearance: Fairly Groomed  Eye Contact:  Good  Speech:  Clear and Coherent  Volume:  Normal  Mood:  {BHH MOOD:22306}  Affect:  {Affect (PAA):22687}  Thought Process:  Coherent and Goal Directed  Orientation:  Full (Time, Place, and Person)  Thought Content:  Logical  Suicidal Thoughts:  {ST/HT (PAA):22692}  Homicidal Thoughts:  {ST/HT (PAA):22692}  Memory:  Immediate;   Good Recent;   Good Remote;   Good  Judgement:  {  Judgement (PAA):22694}  Insight:  {Insight (PAA):22695}  Psychomotor Activity:  Normal  Concentration:  Concentration: Good and Attention Span: Good  Recall:  Good  Fund of Knowledge:Good  Language: Good  Akathisia:  No  Handed:  Right  AIMS (if indicated):  N/A  Assets:  Communication Skills Desire for Improvement  ADL's:  Intact  Cognition: WNL  Sleep:  ***   Assessment  Plan  The patient demonstrates the following risk factors for suicide: Chronic risk factors for suicide include: {Chronic Risk Factors for OZDGUYQ:03474259}Suicide:30414011}. Acute risk factors for suicide include: {Acute Risk Factors for DGLOVFI:43329518}Suicide:30414012}. Protective factors for this patient include: {Protective Factors for Suicide ACZY:60630160}Risk:30414013}. Considering these factors, the overall suicide risk at this point appears to be {Desc; low/moderate/high:110033}. Patient {ACTION; IS/IS FUX:32355732}OT:21021397} appropriate for outpatient follow up.   Treatment Plan Summary: Plan as above   Neysa Hottereina Blade Scheff, MD 3/20/20199:59 AM

## 2017-08-29 ENCOUNTER — Ambulatory Visit (HOSPITAL_COMMUNITY): Payer: Medicare Other | Admitting: Psychiatry

## 2017-08-30 ENCOUNTER — Other Ambulatory Visit: Payer: Self-pay | Admitting: Family Medicine

## 2017-09-02 ENCOUNTER — Encounter: Payer: Self-pay | Admitting: Family Medicine

## 2017-09-03 NOTE — Telephone Encounter (Signed)
Joseph [name], Please call and explain office protocol and privacy to patient. Janine Limboachel H. Tracie HarrierHagler, MD

## 2017-09-03 NOTE — Telephone Encounter (Signed)
I talked with the patient in detail and explained that the reason they would know about his medication is the Physicians office we referred him to would look at the patient med list prior making the appointment.  Often offices will screen narcotic medication users and let them know they do not give out this type of medication.  I explained the security patient feature Epic has and told him I will be happy to set that up for him.  He will think about it and let up know.  Ma 09/03/17

## 2017-09-04 ENCOUNTER — Other Ambulatory Visit: Payer: Self-pay | Admitting: Family Medicine

## 2017-09-19 ENCOUNTER — Other Ambulatory Visit: Payer: Self-pay | Admitting: Family Medicine

## 2017-09-28 ENCOUNTER — Other Ambulatory Visit: Payer: Self-pay | Admitting: Family Medicine

## 2017-10-15 ENCOUNTER — Other Ambulatory Visit: Payer: Self-pay | Admitting: Family Medicine

## 2017-10-18 ENCOUNTER — Other Ambulatory Visit: Payer: Self-pay | Admitting: Family Medicine

## 2017-11-14 ENCOUNTER — Encounter: Payer: Self-pay | Admitting: Family Medicine

## 2017-11-15 ENCOUNTER — Encounter: Payer: Self-pay | Admitting: Family Medicine

## 2017-11-18 ENCOUNTER — Other Ambulatory Visit: Payer: Self-pay | Admitting: Family Medicine

## 2017-11-26 ENCOUNTER — Encounter: Payer: Self-pay | Admitting: Family Medicine

## 2017-12-18 ENCOUNTER — Other Ambulatory Visit: Payer: Self-pay | Admitting: Family Medicine

## 2017-12-27 ENCOUNTER — Other Ambulatory Visit: Payer: Self-pay | Admitting: Family Medicine

## 2017-12-28 ENCOUNTER — Telehealth: Payer: Self-pay | Admitting: Family Medicine

## 2018-01-09 NOTE — Telephone Encounter (Signed)
Received telephone call from EMS stating that this patient was found deceased at his home and he was a pt of Dr Lewis MoccasinHaggler's. Med record review establishes a CAD diagnosis, however he has not had much contact with either Dr Alphonzo GrieveHaggler or cardiology in the past 12 months. EMS also initially stated that the family had decided that they were not going to do any investigation as to the cause of death. Initially when I saw the CAD diagnosis I had intimated that the certificate could be signed , however when I saw how infrequent his medical follow up was , I determined along with the EMS Provider to that she would contact the Medical Examiner as I would not take responsibility for signing the certificate and I was uncertain as to whether Dr Alphonzo GrieveHaggler would  She agreed that this would be no problem

## 2018-01-09 DEATH — deceased

## 2018-01-16 ENCOUNTER — Other Ambulatory Visit: Payer: Self-pay | Admitting: Family Medicine
